# Patient Record
Sex: Male | Born: 1950 | Race: White | Hispanic: No | Marital: Married | Smoking: Never smoker
Health system: Southern US, Community
[De-identification: ages and names within clinical notes are randomized; demographics above are authoritative.]

---

## 2007-08-26 ENCOUNTER — Ambulatory Visit: Payer: Self-pay | Admitting: Family Medicine

## 2007-09-09 ENCOUNTER — Ambulatory Visit: Payer: Self-pay | Admitting: Family Medicine

## 2007-10-21 ENCOUNTER — Ambulatory Visit: Payer: Self-pay | Admitting: Family Medicine

## 2008-01-17 IMAGING — CT CT ABD-PELV W/O CM
4 of 8 series · 14 of 42 positions shown, 20 images · IV contrast (CONTRAST)
Comparison: none

[Series 3: arterial · axial · arterial · 0.68mm/px · z∈[+828,+908]mm · 2 of 50 slices shown]
[im 17/50  soft-tissue]
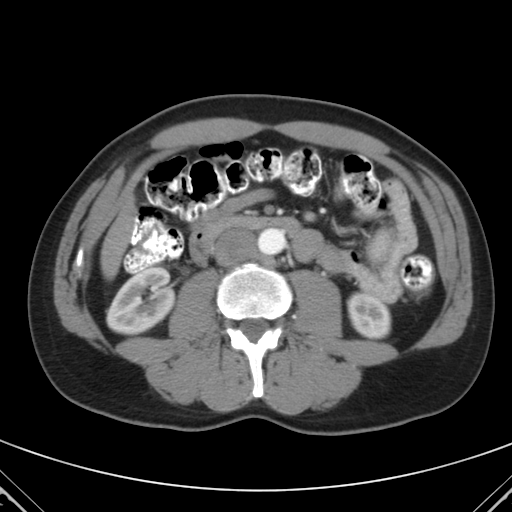
[im 33/50  soft-tissue]
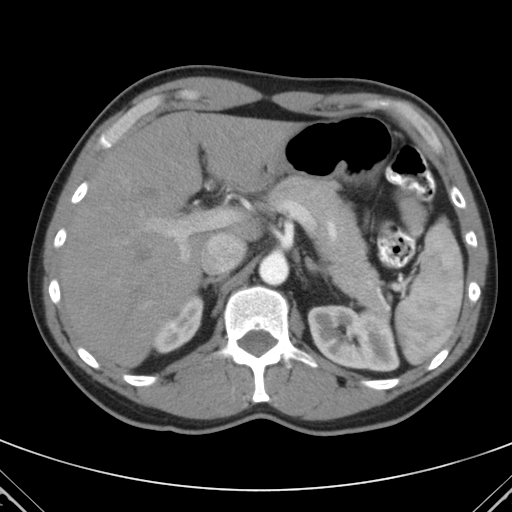

[Series 4: venous · axial · portal-venous · 0.68mm/px · z∈[+615,+915]mm · 5 of 91 slices shown, 10 images]
[im 16/91  soft-tissue]
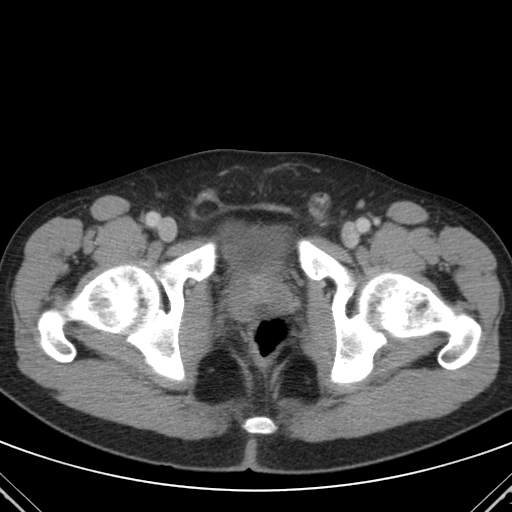
[im 16/91  bone]
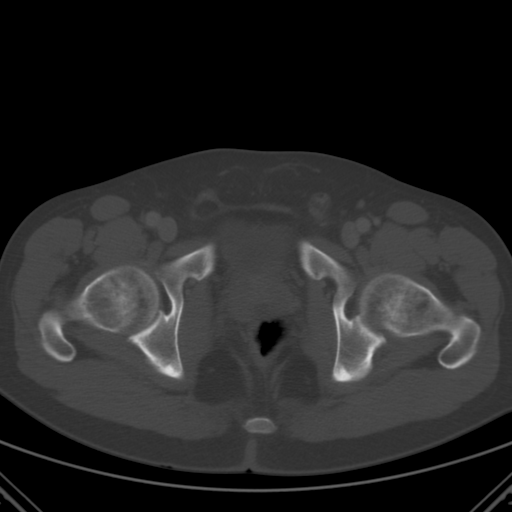
[im 31/91  soft-tissue]
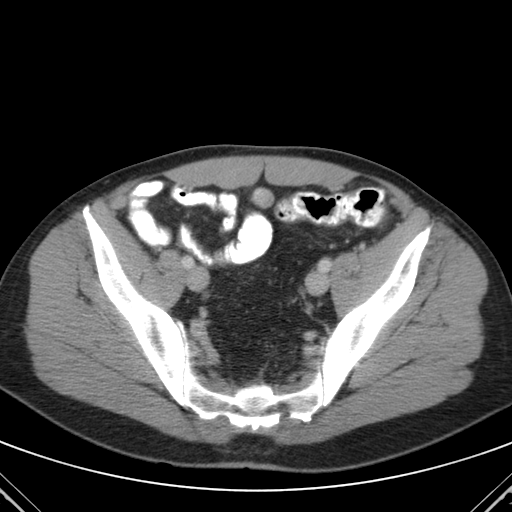
[im 31/91  lung]
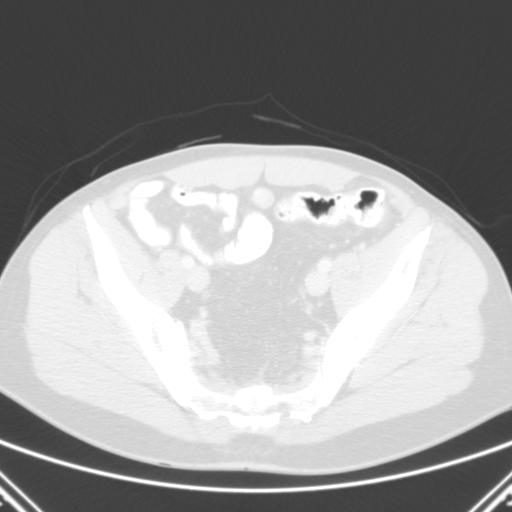
[im 46/91  soft-tissue]
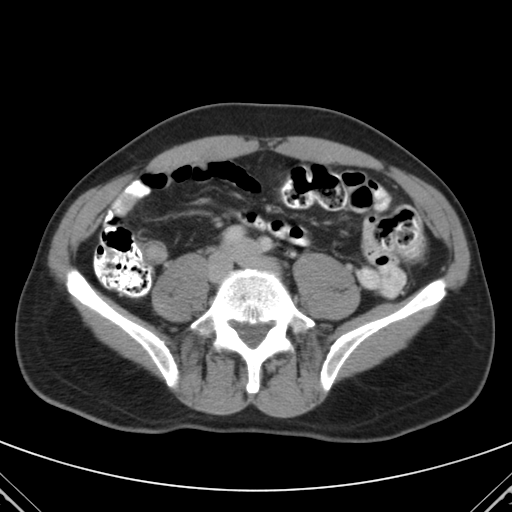
[im 46/91  lung]
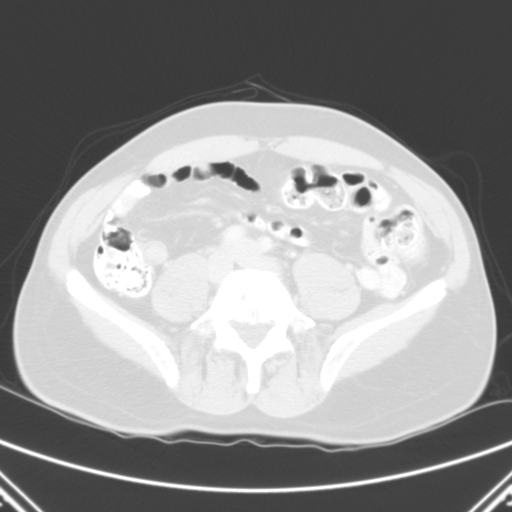
[im 61/91  soft-tissue]
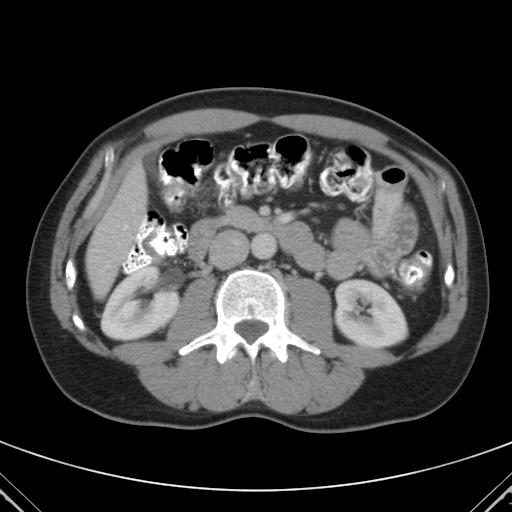
[im 61/91  lung]
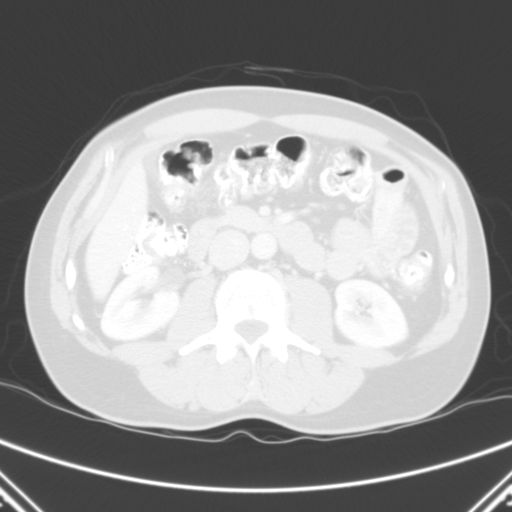
[im 76/91  soft-tissue]
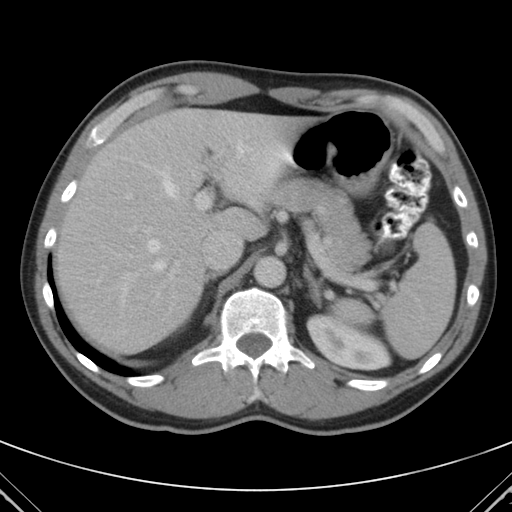
[im 76/91  lung]
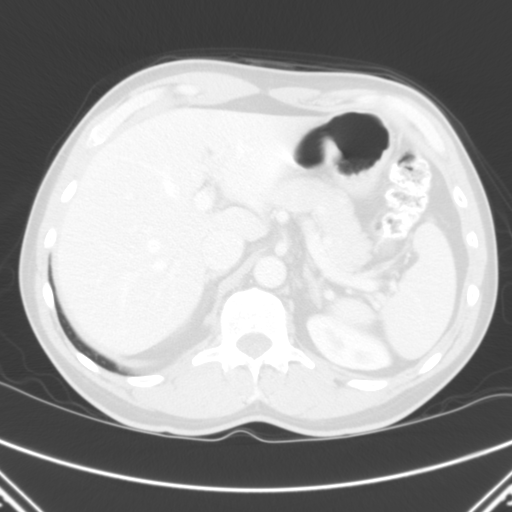

[Series 9: delays · axial · 0.68mm/px · z∈[+625,+880]mm · 4 of 86 slices shown]
[im 18/86  soft-tissue]
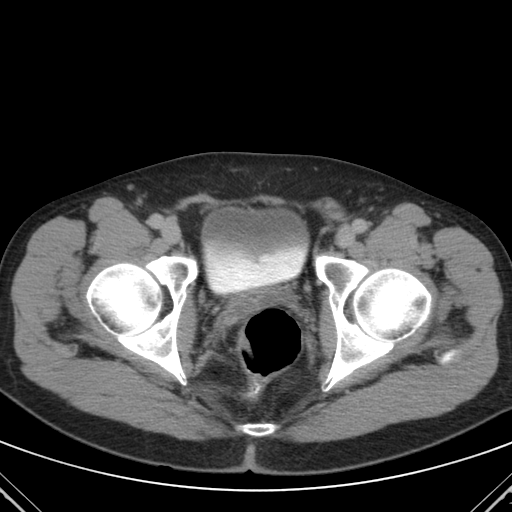
[im 35/86  soft-tissue]
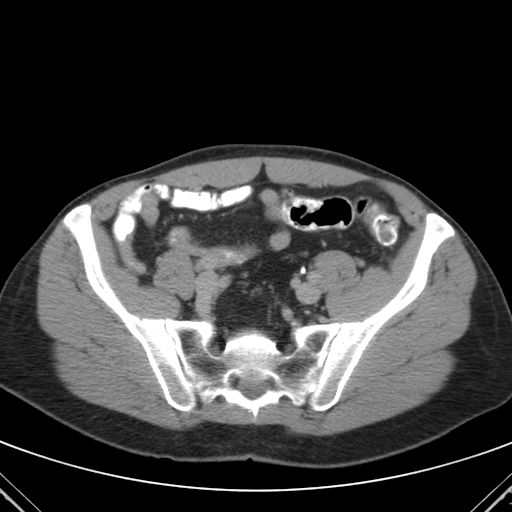
[im 52/86  soft-tissue]
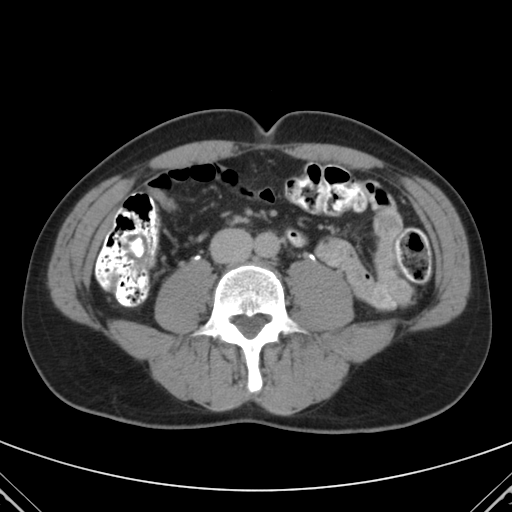
[im 69/86  soft-tissue]
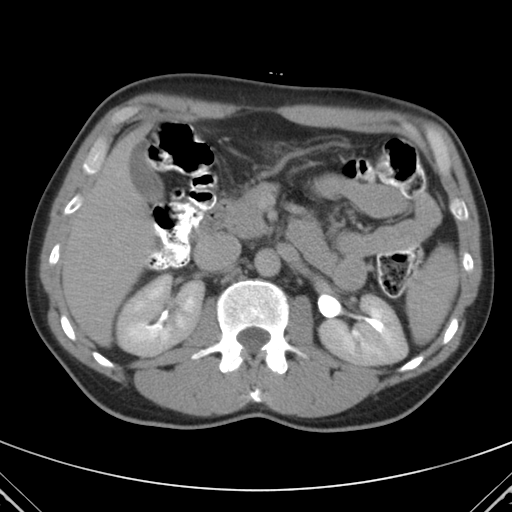

[Series 8058: coronals · coronal · 0.88mm/px · 3 of 74 slices shown, 4 images]
[im 25/74  soft-tissue]
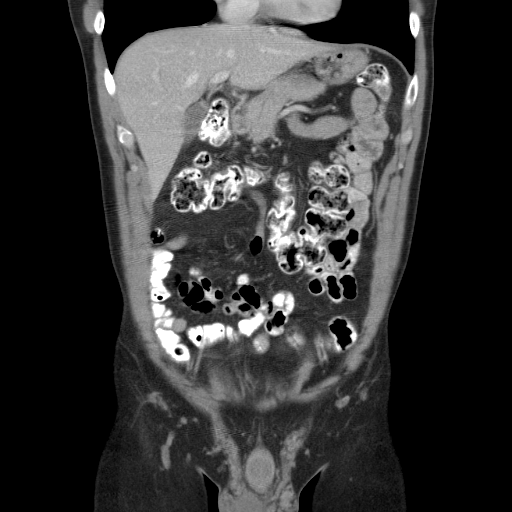
[im 33/74  soft-tissue]
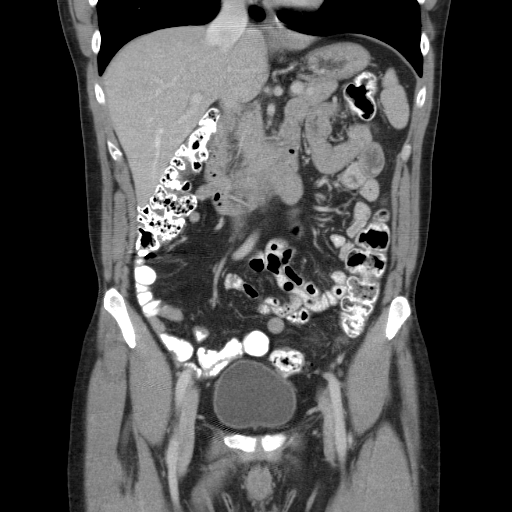
[im 33/74  bone]
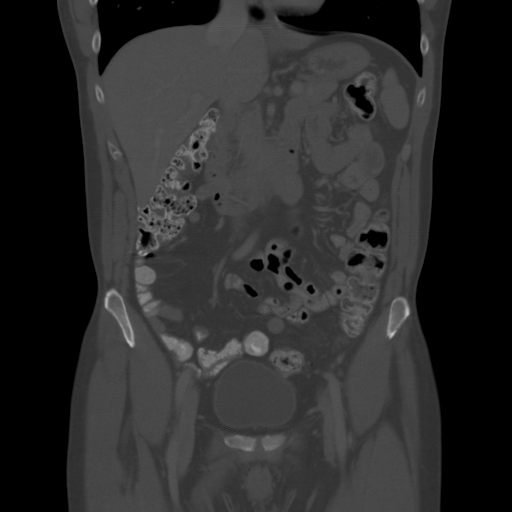
[im 41/74  soft-tissue]
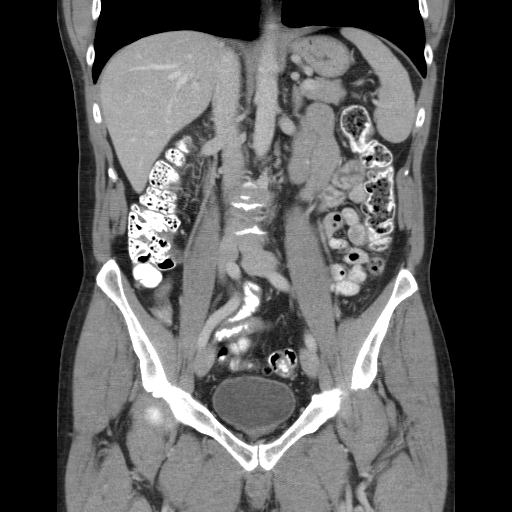

[14 of 42 positions shown; findings below may reference images not displayed]

Canned report from images found in remote index.

Refer to host system for actual result text.

## 2009-04-05 ENCOUNTER — Ambulatory Visit: Payer: Self-pay | Admitting: Family Medicine

## 2009-05-02 ENCOUNTER — Ambulatory Visit (HOSPITAL_COMMUNITY): Admission: RE | Admit: 2009-05-02 | Discharge: 2009-05-02 | Payer: Self-pay | Admitting: Surgery

## 2009-07-03 ENCOUNTER — Ambulatory Visit: Payer: Self-pay | Admitting: Family Medicine

## 2009-10-23 ENCOUNTER — Encounter: Admission: RE | Admit: 2009-10-23 | Discharge: 2009-10-23 | Payer: Self-pay | Admitting: Otolaryngology

## 2010-05-30 ENCOUNTER — Ambulatory Visit: Payer: Self-pay | Admitting: Family Medicine

## 2010-12-27 LAB — HEMOGLOBIN AND HEMATOCRIT, BLOOD: HCT: 46.2 % (ref 39.0–52.0)

## 2011-02-03 NOTE — Op Note (Signed)
Harris, Chad            ACCOUNT NO.:  [id number]   MEDICAL RECORD NO.:  [id number]          PATIENT TYPE:  AMB   LOCATION:  DAY                          FACILITY:  [facility]   PHYSICIAN:  Sandria Bales. Ezzard Standing, M.D.  DATE OF BIRTH:  25-Jun-1951   DATE OF PROCEDURE:  05/02/2009  DATE OF DISCHARGE:                               OPERATIVE REPORT   Date of Surgery - 02 May 2009   PREOPERATIVE DIAGNOSIS:  Right inguinal hernia, recurrent.   POSTOPERATIVE DIAGNOSIS:  Recurrent right inguinal hernia, direct.   PROCEDURE:  Laparoscopic right inguinal hernia repair.   SURGEON:  Dr. Ovidio Kin.   ASSESSMENT:  None.   ANESTHESIA:  General endotracheal.   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  I discussed with him the options of  treatment, both surgical laparoscopic and open repair.  He wants to  proceed with a laparoscopic hernia repair.  I discussed with him the  indications and potential complications, which include,  but are not  limited to bleeding, infection, nerve injury and recurrence of hernia.   DESCRIPTION OF OPERATIVE NOTE:  With the patient in the supine position,  was given general endotracheal anesthetic supervised by Dr. Fernanda Drum.  A time-out was held identifying the patient, the location of  the surgery and the procedure.  The surgical check list was run.   His was shaved and prepped with Betadine solution.  He had a Foley  catheter in place and sterilely draped.  I created an infraumbilical  incision and went to the rectus fascia on the right, the anterior rectus  fascia.  I got to the rectus muscles which were retracted anteriorly,  and got into the preperitoneal space where I passed the preperitoneal  balloon and insufflated this under direct visualization.   I then placed a left lower quadrant 5 mm Ethicon trocar and  a right  lower quadrant 5 mm trocar under direct visualization.  I got into the  preperitoneal space.  I dissected preperitoneal tissue off of the pubic  bone, off Cooper's ligament.  His hernia was a medium-sized direct  inguinal hernia.  I identified the cord structures and saw no evidence  of indirect inguinal hernia.  There was some scarring out further  laterally.  This was probably from his prior hernia surgery when he was  younger.  I then carried out an inguinal floor repair using the pre-cut  Atrium C-Qurlite mesh.  This lay flat against the inguinal floor,  covering the hernia defect.  I used nine staples to secure the mesh.   The patient tolerated the procedure well.  There was no bleeding.  The  mesh seemed to lay flat.  I then desufflated one time, to check to make  sure there were no problems.  Then I removed all trocars under direct  visualization.  I closed the anterior rectus fascia with an interrupted  0 Vicryl suture.  I infiltrated 20 mL  of 0.25% Marcaine and closed the  skin with a 5-0 Vicryl suture.  I painted the wound with Dermabond.   Sponge and needle count were correct at the end of the case.  He was  transferred to the recovery room in good condition.   DISPOSITION:  He will go home tonight.      Sandria Bales. Ezzard Standing, M.D.  Electronically Signed     DHN/MEDQ  D:  05/02/2009  T:  05/02/2009  Job:  [id number]   cc:   Sharlot Gowda, M.D.  Fax: [phone]

## 2011-04-13 ENCOUNTER — Encounter: Payer: Self-pay | Admitting: Family Medicine

## 2011-04-13 ENCOUNTER — Ambulatory Visit (INDEPENDENT_AMBULATORY_CARE_PROVIDER_SITE_OTHER): Payer: BC Managed Care – PPO | Admitting: Family Medicine

## 2011-04-13 VITALS — BP 150/92 | HR 68 | Wt 191.0 lb

## 2011-04-13 DIAGNOSIS — R51 Headache: Secondary | ICD-10-CM

## 2011-04-13 DIAGNOSIS — I499 Cardiac arrhythmia, unspecified: Secondary | ICD-10-CM

## 2011-04-13 LAB — CBC WITH DIFFERENTIAL/PLATELET
Basophils Relative: 1 % (ref 0–1)
Eosinophils Absolute: 0.1 10*3/uL (ref 0.0–0.7)
Eosinophils Relative: 1 % (ref 0–5)
Hemoglobin: 15 g/dL (ref 13.0–17.0)
MCH: 30.6 pg (ref 26.0–34.0)
MCV: 88.8 fL (ref 78.0–100.0)
Monocytes Absolute: 0.3 10*3/uL (ref 0.1–1.0)
Monocytes Relative: 5 % (ref 3–12)
Neutrophils Relative %: 68 % (ref 43–77)
Platelets: 143 10*3/uL — ABNORMAL LOW (ref 150–400)
RBC: 4.9 MIL/uL (ref 4.22–5.81)
WBC: 6 10*3/uL (ref 4.0–10.5)

## 2011-04-13 LAB — COMPREHENSIVE METABOLIC PANEL
AST: 21 U/L (ref 0–37)
Albumin: 4.6 g/dL (ref 3.5–5.2)
Alkaline Phosphatase: 41 U/L (ref 39–117)
BUN: 19 mg/dL (ref 6–23)
Calcium: 9.6 mg/dL (ref 8.4–10.5)
Chloride: 103 mEq/L (ref 96–112)
Creat: 0.99 mg/dL (ref 0.50–1.35)
Total Bilirubin: 0.7 mg/dL (ref 0.3–1.2)
Total Protein: 6.7 g/dL (ref 6.0–8.3)

## 2011-04-13 NOTE — Progress Notes (Signed)
  Subjective:    Patient ID: Chad Harris, male    DOB: 1951-08-24, 60 y.o.   MRN: [id number]  HPI   4 days ago he noted an irregular heart rate not captured with checking his pulse. He did feel some tension especially in his chest but no shortness of breath, chest pain, diaphoresis. At that time he had gone for a bicycle ride but did not experience any difficulty with that. He is also had more of these episodes not necessarily on a daily basis. He does not smoke. His father did have atrial fibrillation. Over the weekend he developed a headache and also neck discomfort. He was watching more TV then usual. It is bilateral, constant but no blurred vision, double vision, nasal congestion, rhinorrhea   Review of Systems Negative except as above    Objective:   Physical Exam alert and in no distress. Tympanic membranes and canals are normal. Throat is clear. Tonsils are normal. Neck is supple without adenopathy or thyromegaly. Cardiac exam shows a regular sinus rhythm without murmurs or gallops. Lungs are clear to auscultation. EKG shows no acute changes       Assessment & Plan:  Arrhythmia. Headache. I will do routine screening on him. Recommend conservative care for the headache. He will be aware of his symptoms especially in regards to physical activities. He'll be set up for an event monitor.

## 2011-07-17 IMAGING — CR DG ORBITS FOR FOREIGN BODY
2 series · 2 of 2 positions shown · non-contrast
Comparison: None

CLINICAL DATA: Pre MRI/history of metal removed from I

ORBITS FOR FOREIGN BODY - 2 VIEW

[view not recorded (1 of 2)]
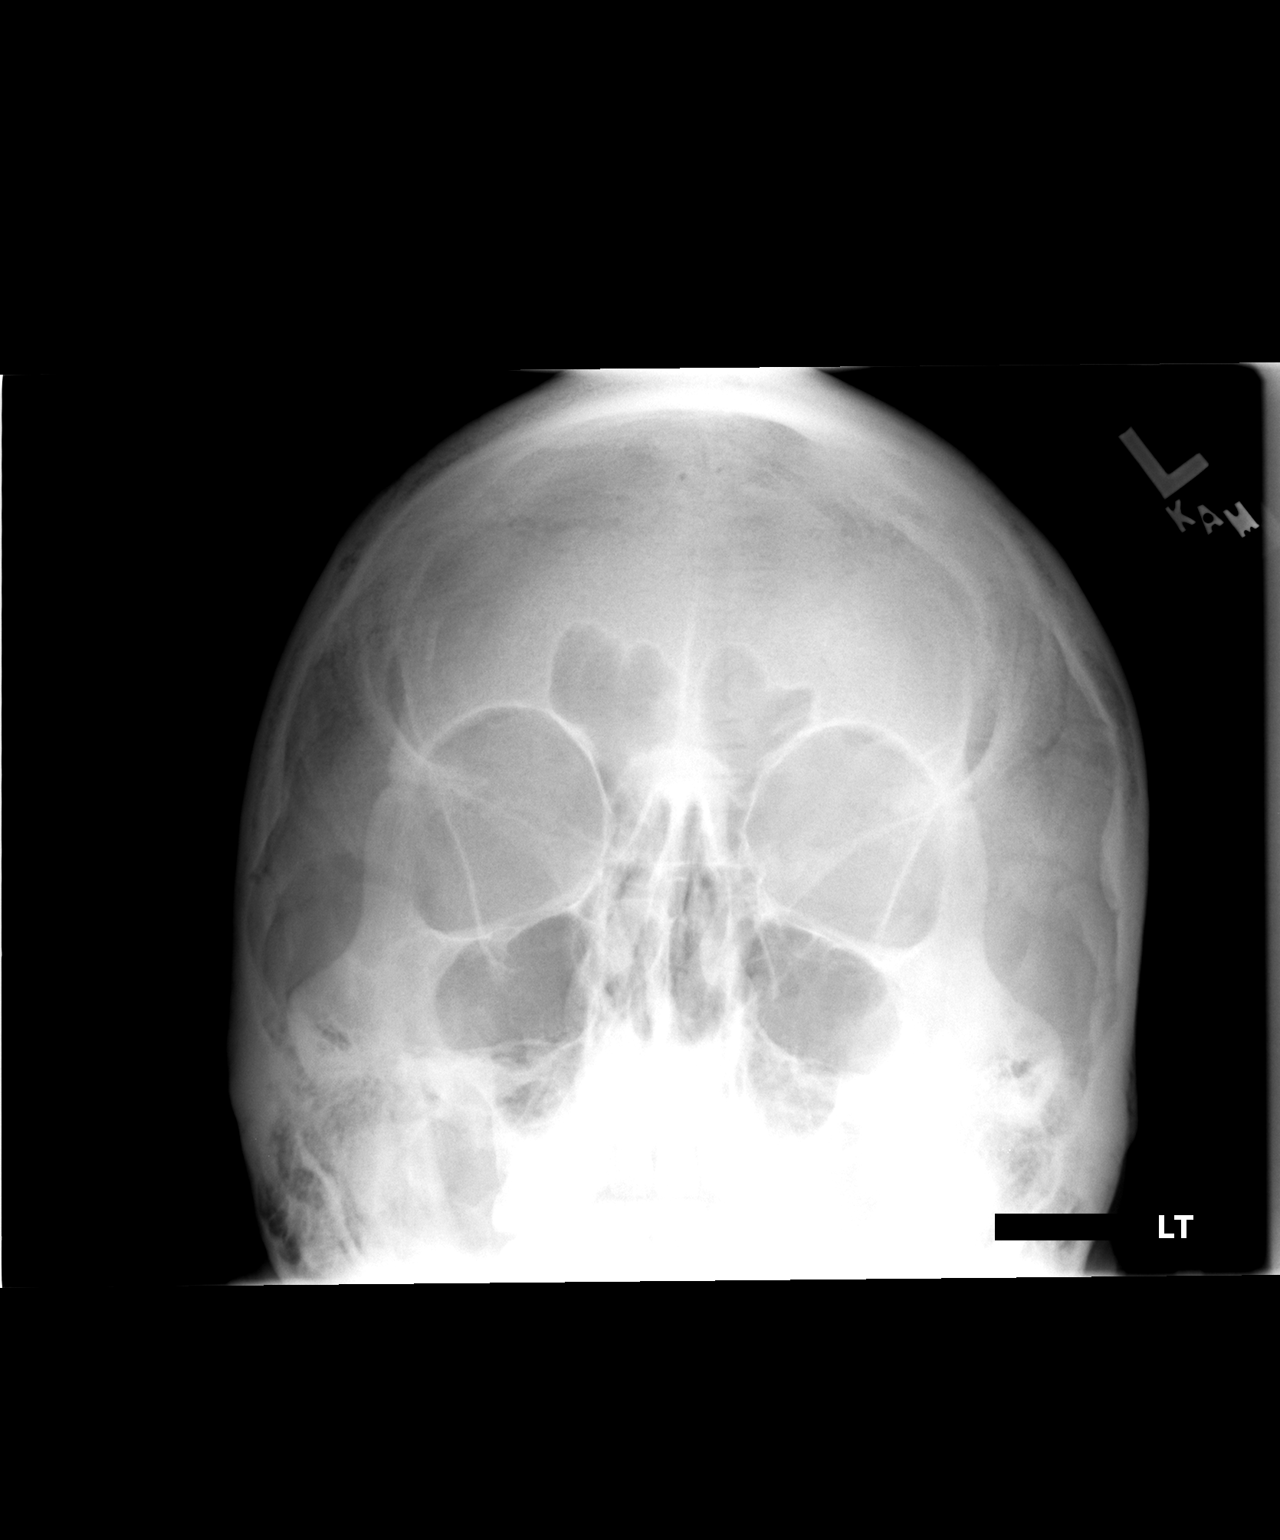

[view not recorded (2 of 2)]
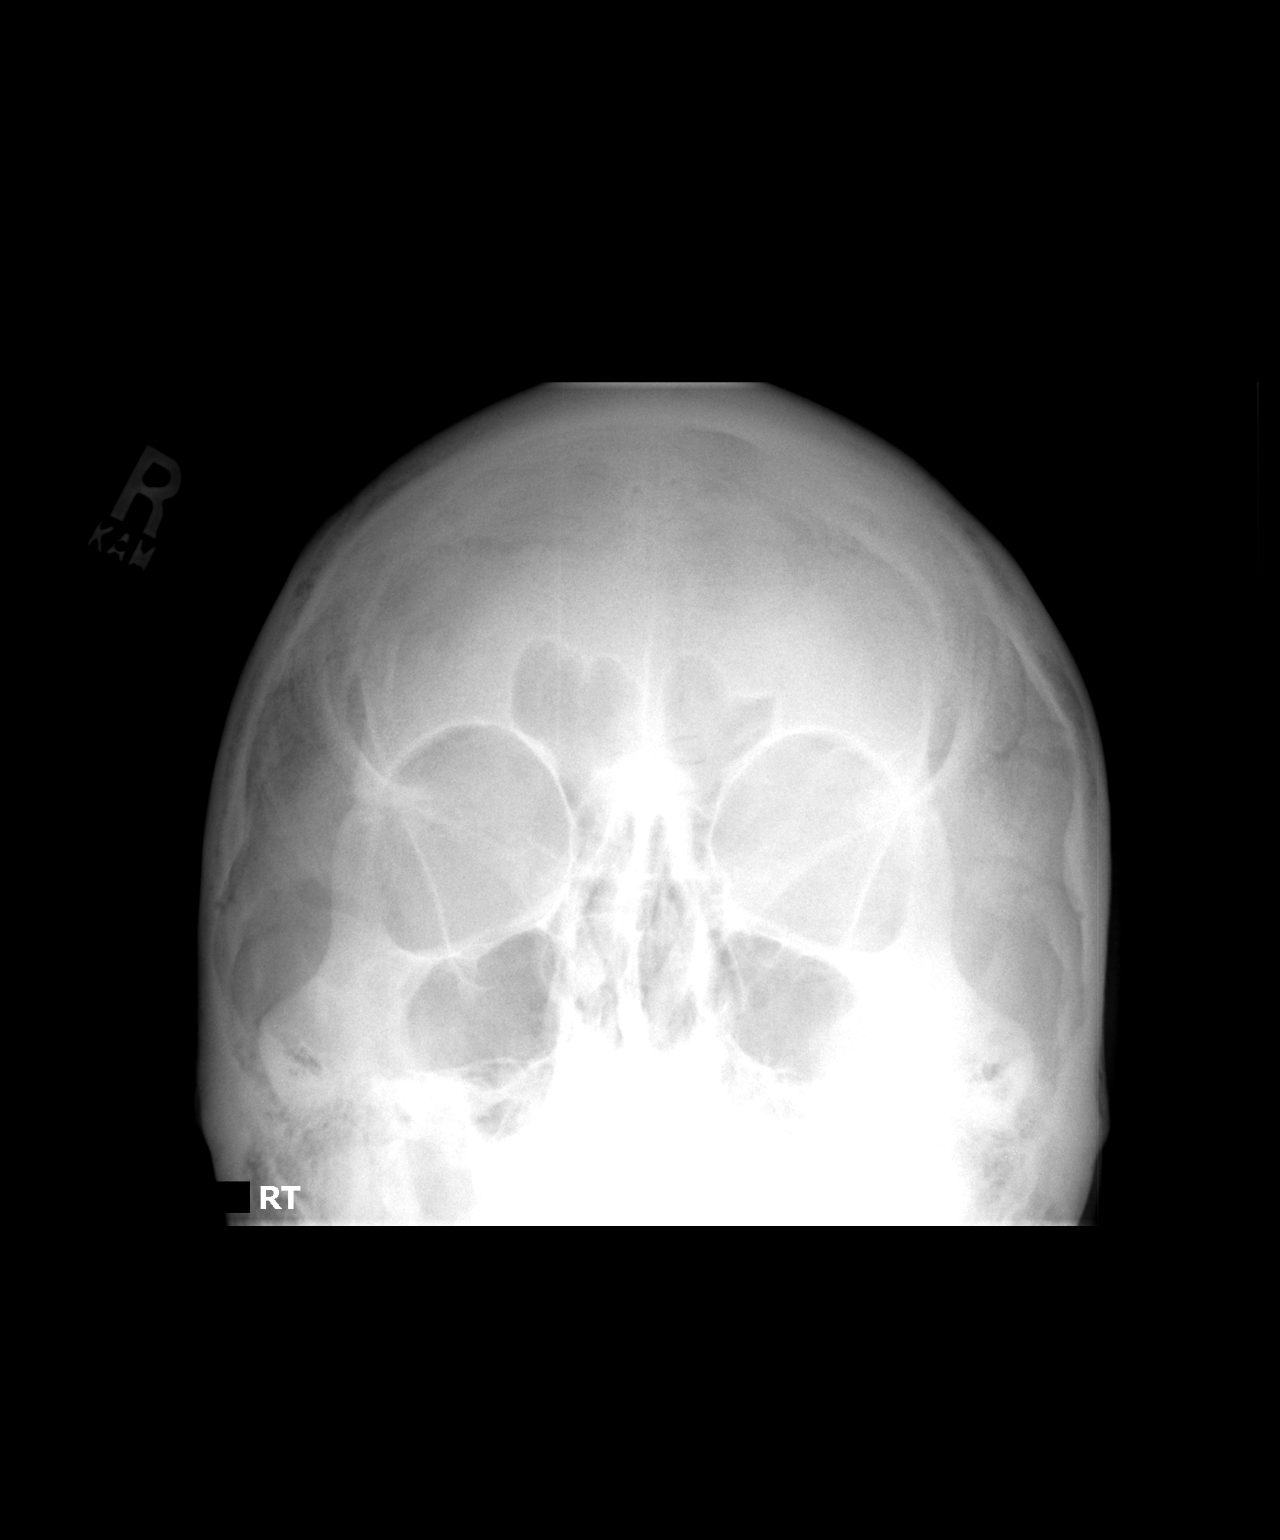

[2 of 2 positions shown; findings below may reference images not displayed]

FINDINGS: No radiopaque foreign body projects over either orbit. No
other pathological findings.
IMPRESSION: No evidence of metallic density foreign body to preclude MRI
scanning.

## 2012-11-11 ENCOUNTER — Ambulatory Visit (INDEPENDENT_AMBULATORY_CARE_PROVIDER_SITE_OTHER): Payer: BC Managed Care – PPO | Admitting: Family Medicine

## 2012-11-11 ENCOUNTER — Encounter: Payer: Self-pay | Admitting: Family Medicine

## 2012-11-11 VITALS — BP 114/72 | HR 74 | Wt 190.0 lb

## 2012-11-11 DIAGNOSIS — Z79899 Other long term (current) drug therapy: Secondary | ICD-10-CM

## 2012-11-11 DIAGNOSIS — R6882 Decreased libido: Secondary | ICD-10-CM

## 2012-11-11 DIAGNOSIS — N529 Male erectile dysfunction, unspecified: Secondary | ICD-10-CM

## 2012-11-11 DIAGNOSIS — R351 Nocturia: Secondary | ICD-10-CM

## 2012-11-11 DIAGNOSIS — Z2911 Encounter for prophylactic immunotherapy for respiratory syncytial virus (RSV): Secondary | ICD-10-CM

## 2012-11-11 LAB — COMPREHENSIVE METABOLIC PANEL
AST: 24 U/L (ref 0–37)
Albumin: 4.5 g/dL (ref 3.5–5.2)
Alkaline Phosphatase: 48 U/L (ref 39–117)
Calcium: 9.5 mg/dL (ref 8.4–10.5)
Potassium: 4.5 mEq/L (ref 3.5–5.3)
Sodium: 139 mEq/L (ref 135–145)
Total Bilirubin: 0.7 mg/dL (ref 0.3–1.2)
Total Protein: 6.7 g/dL (ref 6.0–8.3)

## 2012-11-11 LAB — CBC WITH DIFFERENTIAL/PLATELET
Basophils Absolute: 0 10*3/uL (ref 0.0–0.1)
Eosinophils Absolute: 0.1 10*3/uL (ref 0.0–0.7)
Hemoglobin: 15.6 g/dL (ref 13.0–17.0)
Lymphocytes Relative: 37 % (ref 12–46)
MCHC: 35.3 g/dL (ref 30.0–36.0)
MCV: 85.5 fL (ref 78.0–100.0)
Monocytes Absolute: 0.4 10*3/uL (ref 0.1–1.0)
Monocytes Relative: 8 % (ref 3–12)
RBC: 5.17 MIL/uL (ref 4.22–5.81)
WBC: 5.1 10*3/uL (ref 4.0–10.5)

## 2012-11-11 LAB — LIPID PANEL
HDL: 53 mg/dL (ref >39)
LDL Cholesterol: 56 mg/dL (ref 0–99)
Total CHOL/HDL Ratio: 2.3 Ratio

## 2012-11-11 LAB — HEMOCCULT GUIAC POC 1CARD (OFFICE)

## 2012-11-11 NOTE — Progress Notes (Signed)
  Subjective:    Patient ID: Chad Harris, male    DOB: 1950/11/17, 62 y.o.   MRN: [id number]  HPI He is here for consult concerning several issues. He notes increasing difficulty over the last several years with nocturia. Now he is getting up sometimes as many as 3 and 4 times per night. This is starting to interfere with his social functioning in that he now transposition himself but that he can get to a bathroom without too much difficulty. He states his stream is okay. He does have some hesitancy but does not complaining of incomplete emptying. He's had no back pain, dysuria. He also complains of decreased libido as well as some intermittent difficulty with erectile dysfunction. He has not noted any energy or stamina issues. Work is quite stressful however his home life is going quite well.   Review of Systems     Objective:   Physical Exam alert and in no distress. Tympanic membranes and canals are normal. Throat is clear. Tonsils are normal. Neck is supple without adenopathy or thyromegaly. Cardiac exam shows a regular sinus rhythm without murmurs or gallops. Lungs are clear to auscultation. Rectal exam shows a slightly large prostate with no nodules. Guaiac is negative.       Assessment & Plan:  ED (erectile dysfunction) - Plan: PSA  Libido, decreased - Plan: PSA, Testosterone  Nocturia - Plan: Hemoccult - 1 Card (office)  Encounter for long-term (current) use of other medications - Plan: Lipid panel, CBC with Differential, Comprehensive metabolic panel, Hemoccult - 1 Card (office) I discussed options with him concerning the use of an alpha blocking agent versus shrinking the prostate. I will wait for the lab data come back. Also sample of Cialis given with instructions on use. Followup pending blood results.

## 2012-11-14 NOTE — Progress Notes (Signed)
Quick Note:  I called and gave him the results. Nothing needs to be done at this point ______

## 2013-01-05 ENCOUNTER — Telehealth: Payer: Self-pay | Admitting: Internal Medicine

## 2013-01-05 NOTE — Telephone Encounter (Signed)
Faxed medical records to [facility] @ [phone]

## 2013-11-03 ENCOUNTER — Ambulatory Visit (INDEPENDENT_AMBULATORY_CARE_PROVIDER_SITE_OTHER): Payer: BC Managed Care – PPO | Admitting: Family Medicine

## 2013-11-03 ENCOUNTER — Encounter: Payer: Self-pay | Admitting: Family Medicine

## 2013-11-03 VITALS — BP 120/70 | HR 72 | Wt 191.0 lb

## 2013-11-03 DIAGNOSIS — N529 Male erectile dysfunction, unspecified: Secondary | ICD-10-CM

## 2013-11-03 DIAGNOSIS — N318 Other neuromuscular dysfunction of bladder: Secondary | ICD-10-CM

## 2013-11-03 DIAGNOSIS — N3281 Overactive bladder: Secondary | ICD-10-CM

## 2013-11-03 NOTE — Progress Notes (Signed)
   Subjective:    Patient ID: Chad Harris, male    DOB: [birth year], 63 y.o.   MRN: [id number]  HPI He is here for consult concerning continued difficulty with erectile dysfunction as well as bladder issues. He notes that he does have intermittent difficulty with urinary urgency however he does not describe incomplete emptying, decreased stream or hesitancy. He also states that these symptoms do not occur continuously. He did try Cialis in the past but did have some difficulty with muscle aches and pains and stopped.   Review of Systems     Objective:   Physical Exam Alert and in no distress otherwise not examined       Assessment & Plan:  ED (erectile dysfunction)  OAB (overactive bladder)  Discussed starting the Cialis again to see if the symptoms were really related to this medication. Discussed possibly using a different ED medication as well as something more specific for his intermittent OAB symptoms. He will let me know how the Cialis works. Samples were given.

## 2016-06-19 ENCOUNTER — Encounter: Payer: Self-pay | Admitting: Family Medicine

## 2016-06-19 ENCOUNTER — Ambulatory Visit (INDEPENDENT_AMBULATORY_CARE_PROVIDER_SITE_OTHER): Payer: 59 | Admitting: Family Medicine

## 2016-06-19 VITALS — BP 130/78 | HR 69 | Ht 74.0 in | Wt 193.0 lb

## 2016-06-19 DIAGNOSIS — Z125 Encounter for screening for malignant neoplasm of prostate: Secondary | ICD-10-CM | POA: Diagnosis not present

## 2016-06-19 DIAGNOSIS — R351 Nocturia: Secondary | ICD-10-CM | POA: Diagnosis not present

## 2016-06-19 DIAGNOSIS — Z13220 Encounter for screening for lipoid disorders: Secondary | ICD-10-CM | POA: Diagnosis not present

## 2016-06-19 DIAGNOSIS — Z8042 Family history of malignant neoplasm of prostate: Secondary | ICD-10-CM | POA: Diagnosis not present

## 2016-06-19 DIAGNOSIS — Z1211 Encounter for screening for malignant neoplasm of colon: Secondary | ICD-10-CM | POA: Diagnosis not present

## 2016-06-19 DIAGNOSIS — N529 Male erectile dysfunction, unspecified: Secondary | ICD-10-CM

## 2016-06-19 DIAGNOSIS — N401 Enlarged prostate with lower urinary tract symptoms: Secondary | ICD-10-CM | POA: Diagnosis not present

## 2016-06-19 LAB — LIPID PANEL
Cholesterol: 130 mg/dL (ref 125–200)
HDL: 61 mg/dL (ref >=40)
LDL CALC: 52 mg/dL (ref <130)
Total CHOL/HDL Ratio: 2.1 Ratio (ref <=5.0)
Triglycerides: 86 mg/dL (ref <150)
VLDL: 17 mg/dL (ref <30)

## 2016-06-19 LAB — COMPREHENSIVE METABOLIC PANEL
ALK PHOS: 48 U/L (ref 40–115)
ALT: 21 U/L (ref 9–46)
AST: 22 U/L (ref 10–35)
Albumin: 4.8 g/dL (ref 3.6–5.1)
BILIRUBIN TOTAL: 0.8 mg/dL (ref 0.2–1.2)
BUN: 15 mg/dL (ref 7–25)
CO2: 29 mmol/L (ref 20–31)
CREATININE: 1.06 mg/dL (ref 0.70–1.25)
Calcium: 10.3 mg/dL (ref 8.6–10.3)
Chloride: 103 mmol/L (ref 98–110)
GLUCOSE: 98 mg/dL (ref 65–99)
Potassium: 4.7 mmol/L (ref 3.5–5.3)
SODIUM: 137 mmol/L (ref 135–146)
Total Protein: 7.3 g/dL (ref 6.1–8.1)

## 2016-06-19 LAB — CBC WITH DIFFERENTIAL/PLATELET
BASOS PCT: 1 %
Basophils Absolute: 71 cells/uL (ref 0–200)
EOS PCT: 2 %
Eosinophils Absolute: 142 cells/uL (ref 15–500)
HCT: 47.8 % (ref 38.5–50.0)
Hemoglobin: 17 g/dL (ref 13.2–17.1)
LYMPHS PCT: 28 %
Lymphs Abs: 1988 cells/uL (ref 850–3900)
MCH: 30.8 pg (ref 27.0–33.0)
MCHC: 35.6 g/dL (ref 32.0–36.0)
MCV: 86.6 fL (ref 80.0–100.0)
MONO ABS: 639 {cells}/uL (ref 200–950)
MPV: 8.9 fL (ref 7.5–12.5)
Monocytes Relative: 9 %
NEUTROS PCT: 60 %
Neutro Abs: 4260 cells/uL (ref 1500–7800)
PLATELETS: 167 10*3/uL (ref 140–400)
RBC: 5.52 MIL/uL (ref 4.20–5.80)
RDW: 13.5 % (ref 11.0–15.0)
WBC: 7.1 10*3/uL (ref 4.0–10.5)

## 2016-06-19 LAB — PSA: PSA: 1.1 ng/mL (ref <=4.0)

## 2016-06-19 LAB — HEMOCCULT GUIAC POC 1CARD (OFFICE)
FECAL OCCULT BLD: NEGATIVE
OCCULT BLOOD DATE: 92917

## 2016-06-19 NOTE — Progress Notes (Signed)
   Subjective:    Patient ID: Chad Harris, male    DOB: 03-25-1951, 65 y.o.   MRN: [id number]  HPI He is here for consultation concerning increasing difficulty with nocturia. Sometimes can get up as much as 3 times per night. He does note slight decrease in his stream as well as questionable incomplete emptying but no hesitancy or history of urgency. He also has had intermittent difficulty with erectile dysfunction. He has not been seen in quite some time and would like to have further blood work done. He does have a family history of prostate cancer in that his father in his mid 68s was apparently treated with radiation. His last colonoscopy was in 2004. Immunizations were reviewed and he thinks he has had tetanus and will check his record. Work and home life are going quite well.   Review of Systems     Objective:   Physical Exam Alert and in no distress. Tympanic membranes and canals are normal. Pharyngeal area is normal. Neck is supple without adenopathy or thyromegaly. Cardiac exam shows a regular sinus rhythm without murmurs or gallops. Lungs are clear to auscultation. Abdominal exam shows no masses or tenderness. Genital exam shows normal penis and testes. Rectal exam does show a slightly large prostate, no nodules palpable. Guaiac was negative.        Assessment & Plan:  BPH associated with nocturia - Plan: CBC with Differential/Platelet, Comprehensive metabolic panel, PSA  Erectile dysfunction, unspecified erectile dysfunction type - Plan: CBC with Differential/Platelet, Comprehensive metabolic panel  Family history of prostate cancer in father - Plan: PSA  Screening for prostate cancer - Plan: PSA  Screening for colon cancer - Plan: POCT occult blood stool  Screening for lipid disorders - Plan: Lipid panel Discussed treatment options and it looks more like these are prostate related symptoms as opposed bladder and will more likely place him on finasteride. He will  check with his insurance concerning coverage for Cologuard for colon cancer screening. He will also check his records to see if he has had a recent tetanus shot.

## 2016-06-25 ENCOUNTER — Telehealth: Payer: Self-pay | Admitting: Family Medicine

## 2016-06-25 MED ORDER — FINASTERIDE 5 MG PO TABS: 5.0000 mg | ORAL_TABLET | Freq: Every day | ORAL | 3 refills | Status: DC

## 2016-06-25 NOTE — Telephone Encounter (Signed)
Let him know that I called in the finasteride and have him come in Friday for a TDaP

## 2016-06-25 NOTE — Telephone Encounter (Signed)
Left vm word for word and put him on nurse sched.

## 2016-06-25 NOTE — Telephone Encounter (Signed)
Pt called and was wanting to know if you wanted to put him on Flomax or Finasteride , pt also said he needed a up to date tetanus shot and would like to come in on Friday if possible, pt can be reached at [phone] and leave a message on that if needed

## 2016-06-26 ENCOUNTER — Other Ambulatory Visit (INDEPENDENT_AMBULATORY_CARE_PROVIDER_SITE_OTHER): Payer: 59

## 2016-06-26 DIAGNOSIS — Z23 Encounter for immunization: Secondary | ICD-10-CM | POA: Diagnosis not present

## 2016-09-09 ENCOUNTER — Encounter: Payer: Self-pay | Admitting: Family Medicine

## 2016-09-09 ENCOUNTER — Ambulatory Visit (INDEPENDENT_AMBULATORY_CARE_PROVIDER_SITE_OTHER): Payer: 59 | Admitting: Family Medicine

## 2016-09-09 VITALS — BP 116/78 | HR 74 | Ht 74.0 in | Wt 199.0 lb

## 2016-09-09 DIAGNOSIS — N529 Male erectile dysfunction, unspecified: Secondary | ICD-10-CM | POA: Diagnosis not present

## 2016-09-09 DIAGNOSIS — N401 Enlarged prostate with lower urinary tract symptoms: Secondary | ICD-10-CM

## 2016-09-09 DIAGNOSIS — R1031 Right lower quadrant pain: Secondary | ICD-10-CM | POA: Diagnosis not present

## 2016-09-09 DIAGNOSIS — R351 Nocturia: Secondary | ICD-10-CM | POA: Diagnosis not present

## 2016-09-09 MED ORDER — AVANAFIL 200 MG PO TABS: 1.0000 | ORAL_TABLET | ORAL | 0 refills | Status: DC

## 2016-09-09 NOTE — Progress Notes (Signed)
   Subjective:    Patient ID: Chad Harris, male    DOB: 09-Aug-1951, 65 y.o.   MRN: [id number]  HPI He is here for consult concerning multiple issues. He had right herniorrhaphy in 2010 and did experience some postoperative discomfort in that area. He has had intermittent discomfort in that area until last 2 weeks when the discomfort has been pretty regular I will read does change slightly with some motions. He's felt no lesions in that area. Bowel habits and urinary habits have not changed. He's had no discharge dysuria. He does have a previous history of difficulty with urgency but no incomplete emptying or decreased stream. He has tried regular doses of Cialis to help with both his ED and possibly BPH related symptoms but states that it did not help and adnexa caused back discomfort. In the past he also tried finasteride and found it to not help with his urinary symptoms and possibly did interfere with erectile issues.   Review of Systems     Objective:   Physical Exam Alert and in no distress. Abdominal exam shows no masses or tenderness. Inguinal area was nontender. No hernia noted. Testes normal. Rectal not done. Done on his previous exam.       Assessment & Plan:  BPH associated with nocturia  Erectile dysfunction, unspecified erectile dysfunction type - Plan: Avanafil 200 MG TABS  Right inguinal pain - Plan: Ambulatory referral to General Surgery I will refer to Gen. surgery to further evaluate a right inguinal discomfort. We also talked about referral to urology but will wait for general surgery and then proceed further with urologic evaluation help with both the ED and urinary symptoms.

## 2017-12-07 DIAGNOSIS — H90A22 Sensorineural hearing loss, unilateral, left ear, with restricted hearing on the contralateral side: Secondary | ICD-10-CM | POA: Diagnosis not present

## 2017-12-07 DIAGNOSIS — H90A31 Mixed conductive and sensorineural hearing loss, unilateral, right ear with restricted hearing on the contralateral side: Secondary | ICD-10-CM | POA: Diagnosis not present

## 2017-12-15 DIAGNOSIS — H52203 Unspecified astigmatism, bilateral: Secondary | ICD-10-CM | POA: Diagnosis not present

## 2017-12-15 DIAGNOSIS — H524 Presbyopia: Secondary | ICD-10-CM | POA: Diagnosis not present

## 2018-05-12 ENCOUNTER — Telehealth: Payer: Self-pay | Admitting: Family Medicine

## 2018-05-12 NOTE — Telephone Encounter (Signed)
Pt called requesting that Dr. Redmond School call him at [phone] regarding something personal

## 2018-06-27 ENCOUNTER — Telehealth: Payer: Self-pay | Admitting: Family Medicine

## 2018-06-27 DIAGNOSIS — Z13220 Encounter for screening for lipoid disorders: Secondary | ICD-10-CM

## 2018-06-27 DIAGNOSIS — N400 Enlarged prostate without lower urinary tract symptoms: Secondary | ICD-10-CM

## 2018-06-27 DIAGNOSIS — N529 Male erectile dysfunction, unspecified: Secondary | ICD-10-CM

## 2018-06-27 NOTE — Telephone Encounter (Signed)
Have him come in 

## 2018-06-27 NOTE — Telephone Encounter (Signed)
Pt coming in for his Welcome to Medicare CPE on this Wednesday (10/9). Pt would like to come in tomorrow for prior labs if possible. Is this ok?

## 2018-06-27 NOTE — Telephone Encounter (Signed)
Pt aware and will be in tomorrow. Chad Harris

## 2018-06-28 ENCOUNTER — Other Ambulatory Visit: Payer: Medicare HMO

## 2018-06-28 DIAGNOSIS — N400 Enlarged prostate without lower urinary tract symptoms: Secondary | ICD-10-CM | POA: Diagnosis not present

## 2018-06-28 DIAGNOSIS — N529 Male erectile dysfunction, unspecified: Secondary | ICD-10-CM | POA: Diagnosis not present

## 2018-06-28 DIAGNOSIS — Z13220 Encounter for screening for lipoid disorders: Secondary | ICD-10-CM

## 2018-06-28 LAB — CBC WITH DIFFERENTIAL/PLATELET
Basophils Absolute: 0.1 10*3/uL (ref 0.0–0.2)
Basos: 1 %
EOS (ABSOLUTE): 0.1 10*3/uL (ref 0.0–0.4)
Eos: 2 %
Hematocrit: 46 % (ref 37.5–51.0)
Hemoglobin: 16.2 g/dL (ref 13.0–17.7)
IMMATURE GRANULOCYTES: 0 %
Immature Grans (Abs): 0 10*3/uL (ref 0.0–0.1)
Lymphocytes Absolute: 1.8 10*3/uL (ref 0.7–3.1)
Lymphs: 33 %
MCH: 30.7 pg (ref 26.6–33.0)
MCHC: 35.2 g/dL (ref 31.5–35.7)
MCV: 87 fL (ref 79–97)
MONOS ABS: 0.5 10*3/uL (ref 0.1–0.9)
Monocytes: 10 %
NEUTROS PCT: 54 %
Neutrophils Absolute: 2.9 10*3/uL (ref 1.4–7.0)
PLATELETS: 190 10*3/uL (ref 150–450)
RBC: 5.28 x10E6/uL (ref 4.14–5.80)
RDW: 13.3 % (ref 12.3–15.4)
WBC: 5.3 10*3/uL (ref 3.4–10.8)

## 2018-06-28 LAB — COMPREHENSIVE METABOLIC PANEL
A/G RATIO: 2.3 — AB (ref 1.2–2.2)
ALK PHOS: 55 IU/L (ref 39–117)
ALT: 17 IU/L (ref 0–44)
AST: 21 IU/L (ref 0–40)
Albumin: 4.5 g/dL (ref 3.6–4.8)
BUN/Creatinine Ratio: 13 (ref 10–24)
BUN: 14 mg/dL (ref 8–27)
Bilirubin Total: 0.7 mg/dL (ref 0.0–1.2)
CO2: 26 mmol/L (ref 20–29)
Calcium: 9.6 mg/dL (ref 8.6–10.2)
Chloride: 102 mmol/L (ref 96–106)
Creatinine, Ser: 1.07 mg/dL (ref 0.76–1.27)
GFR calc Af Amer: 84 mL/min/{1.73_m2} (ref >59)
GFR calc non Af Amer: 72 mL/min/{1.73_m2} (ref >59)
GLOBULIN, TOTAL: 2 g/dL (ref 1.5–4.5)
Glucose: 95 mg/dL (ref 65–99)
POTASSIUM: 5.1 mmol/L (ref 3.5–5.2)
SODIUM: 141 mmol/L (ref 134–144)
Total Protein: 6.5 g/dL (ref 6.0–8.5)

## 2018-06-28 LAB — LIPID PANEL
CHOLESTEROL TOTAL: 118 mg/dL (ref 100–199)
Chol/HDL Ratio: 2.3 ratio (ref 0.0–5.0)
HDL: 52 mg/dL (ref >39)
LDL CALC: 52 mg/dL (ref 0–99)
TRIGLYCERIDES: 69 mg/dL (ref 0–149)
VLDL CHOLESTEROL CAL: 14 mg/dL (ref 5–40)

## 2018-06-29 ENCOUNTER — Ambulatory Visit: Payer: 59 | Admitting: Family Medicine

## 2018-07-05 ENCOUNTER — Encounter: Payer: Self-pay | Admitting: Family Medicine

## 2018-07-05 ENCOUNTER — Ambulatory Visit (INDEPENDENT_AMBULATORY_CARE_PROVIDER_SITE_OTHER): Payer: Medicare HMO | Admitting: Family Medicine

## 2018-07-05 DIAGNOSIS — R351 Nocturia: Secondary | ICD-10-CM | POA: Diagnosis not present

## 2018-07-05 DIAGNOSIS — Z0000 Encounter for general adult medical examination without abnormal findings: Secondary | ICD-10-CM

## 2018-07-05 DIAGNOSIS — Z23 Encounter for immunization: Secondary | ICD-10-CM | POA: Diagnosis not present

## 2018-07-05 DIAGNOSIS — N401 Enlarged prostate with lower urinary tract symptoms: Secondary | ICD-10-CM

## 2018-07-05 DIAGNOSIS — Z1159 Encounter for screening for other viral diseases: Secondary | ICD-10-CM

## 2018-07-05 DIAGNOSIS — N529 Male erectile dysfunction, unspecified: Secondary | ICD-10-CM | POA: Diagnosis not present

## 2018-07-05 DIAGNOSIS — Z1211 Encounter for screening for malignant neoplasm of colon: Secondary | ICD-10-CM

## 2018-07-05 DIAGNOSIS — Z13220 Encounter for screening for lipoid disorders: Secondary | ICD-10-CM

## 2018-07-05 MED ORDER — OFLOXACIN 0.3 % OP SOLN: 1.0000 [drp] | Freq: Four times a day (QID) | OPHTHALMIC | 1 refills | Status: DC

## 2018-07-05 MED ORDER — IBUPROFEN 600 MG PO TABS
600.0000 mg | ORAL_TABLET | Freq: Four times a day (QID) | ORAL | 1 refills | Status: DC | PRN
Start: 2018-07-05 — End: 2019-07-10

## 2018-07-05 MED ORDER — DOXYCYCLINE HYCLATE 100 MG PO TABS: 100.0000 mg | ORAL_TABLET | Freq: Two times a day (BID) | ORAL | 1 refills | Status: DC

## 2018-07-05 NOTE — Progress Notes (Signed)
Chad Harris is a 67 y.o. male who presents for annual wellness visit and follow-up on chronic medical conditions.  He has has had some difficulty with BPH symptoms but none recently.  He has also had some erectile dysfunction but does have sildenafil at home for that.  He would like some medications to be prescribed; and ophthalmic medication that he occasionally use.  He also would like doxycycline and ibuprofen.  I have no problem with having these available to him.  He otherwise has no particular concerns or complaints.  He is now retired.  He and his wife are enjoying their retirement.  He did have a colonoscopy over 10 years ago which was normal.  Immunizations and Health Maintenance Immunization History  Administered Date(s) Administered  . Tdap 06/26/2016  . Zoster 11/11/2012   Health Maintenance Due  Topic Date Due  . Hepatitis C Screening  [birth year]  . HIV Screening  10/12/1967  . COLONOSCOPY  10/11/2002  . PNA vac Low Risk Adult (1 of 2 - PCV13) 10/11/2017  . INFLUENZA VACCINE  04/21/2018    Last colonoscopy: 2010 due soon Last PSA: 06-19-2016 Dentist:Six month ago Ophtho: feb 2019 Exercise: weights and biking , hiking  Other doctors caring for patient include: Jeffie Pollock. [place]burner. Newman.Amedeo Plenty  Advanced Directives:Yes copy asked for    Depression screen:  See questionnaire below.     Depression screen [facility] 2/9 07/05/2018 06/19/2016  Decreased Interest 0 0  Down, Depressed, Hopeless 0 0  PHQ - 2 Score 0 0    Fall Screen: See Questionaire below.   Fall Risk  07/05/2018 06/19/2016  Falls in the past year? No No    ADL screen:  See questionnaire below.  Functional Status Survey: Is the patient deaf or have difficulty hearing?: Yes Does the patient have difficulty seeing, even when wearing glasses/contacts?: No Does the patient have difficulty concentrating, remembering, or making decisions?: No Does the patient have difficulty walking or climbing stairs?:  No Does the patient have difficulty dressing or bathing?: No Does the patient have difficulty doing errands alone such as visiting a doctor's office or shopping?: No   Review of Systems  Constitutional: -, -unexpected weight change, -anorexia, -fatigue Allergy: -sneezing, -itching, -congestion Dermatology: denies changing moles, rash, lumps ENT: -runny nose, -ear pain, -sore throat,  Cardiology:  -chest pain, -palpitations, -orthopnea, Respiratory: -cough, -shortness of breath, -dyspnea on exertion, -wheezing,  Gastroenterology: -abdominal pain, -nausea, -vomiting, -diarrhea, -constipation, -dysphagia Hematology: -bleeding or bruising problems Musculoskeletal: -arthralgias, -myalgias, -joint swelling, -back pain, - Ophthalmology: -vision changes,  Urology: -dysuria, -difficulty urinating,  -urinary frequency, -urgency, incontinence Neurology: -, -numbness, , -memory loss, -falls, -dizziness    PHYSICAL EXAM:  General Appearance: Alert, cooperative, no distress, appears stated age Head: Normocephalic, without obvious abnormality, atraumatic Eyes: PERRL, conjunctiva/corneas clear, EOM's intact, fundi benign Ears: Normal TM's and external ear canals Nose: Nares normal, mucosa normal, no drainage or sinus   tenderness Throat: Lips, mucosa, and tongue normal; teeth and gums normal Neck: Supple, no lymphadenopathy, thyroid:no enlargement/tenderness/nodules; no carotid bruit or JVD Lungs: Clear to auscultation bilaterally without wheezes, rales or ronchi; respirations unlabored Heart: Regular rate and rhythm, S1 and S2 normal, no murmur, rub or gallop Abdomen: Soft, non-tender, nondistended, normoactive bowel sounds, no masses, no hepatosplenomegaly Extremities: No clubbing, cyanosis or edema Pulses: 2+ and symmetric all extremities Skin: Skin color, texture, turgor normal, no rashes or lesions Lymph nodes: Cervical, supraclavicular, and axillary nodes normal Neurologic: CNII-XII  intact, normal strength, sensation and gait;  reflexes 2+ and symmetric throughout   Psych: Normal mood, affect, hygiene and grooming  ASSESSMENT/PLAN: Routine general medical examination at a health care facility - Plan: CBC with Differential/Platelet, Comprehensive metabolic panel, Lipid panel  BPH associated with nocturia  Erectile dysfunction, unspecified erectile dysfunction type  Screening for lipid disorders - Plan: Lipid panel  Encounter for hepatitis C screening test for low risk patient - Plan: Hepatitis C antibody  Need for vaccination against Streptococcus pneumoniae - Plan: Pneumococcal conjugate vaccine 13-valent  Screening for colon cancer - Plan: Cologuard  Need for influenza vaccination - Plan: Flu vaccine HIGH DOSE PF (Fluzone High dose)  Discussed PSA screening (risks/benefits), recommended at least 30 minutes of aerobic activity at least 5 days/week; healthy diet and alcohol recommendations (less than or equal to 2 drinks/day) reviewed;Immunization recommendations discussed.  Colonoscopy recommendations reviewed.   Medicare Attestation I have personally reviewed: The patient's medical and social history Their use of alcohol, tobacco or illicit drugs Their current medications and supplements The patient's functional ability including ADLs,fall risks, home safety risks, cognitive, and hearing and visual impairment Diet and physical activities Evidence for depression or mood disorders  The patient's weight, height, and BMI have been recorded in the chart.  I have made referrals, counseling, and provided education to the patient based on review of the above and I have provided the patient with a written personalized care plan for preventive services.     Chad Alexanders, MD   07/05/2018

## 2018-07-06 ENCOUNTER — Encounter: Payer: Self-pay | Admitting: Family Medicine

## 2018-07-06 LAB — HEPATITIS C ANTIBODY: Hep C Virus Ab: <0.1 s/co ratio (ref 0.0–0.9)

## 2018-07-12 DIAGNOSIS — Z1211 Encounter for screening for malignant neoplasm of colon: Secondary | ICD-10-CM | POA: Diagnosis not present

## 2018-07-18 DIAGNOSIS — R69 Illness, unspecified: Secondary | ICD-10-CM | POA: Diagnosis not present

## 2018-07-18 LAB — COLOGUARD: COLOGUARD: NEGATIVE

## 2018-07-19 ENCOUNTER — Telehealth: Payer: Self-pay

## 2018-07-19 DIAGNOSIS — R69 Illness, unspecified: Secondary | ICD-10-CM | POA: Diagnosis not present

## 2018-07-19 NOTE — Telephone Encounter (Signed)
Pt was given results. [facility]

## 2018-07-28 DIAGNOSIS — M79671 Pain in right foot: Secondary | ICD-10-CM | POA: Diagnosis not present

## 2018-11-08 DIAGNOSIS — M542 Cervicalgia: Secondary | ICD-10-CM | POA: Diagnosis not present

## 2018-11-08 DIAGNOSIS — M25512 Pain in left shoulder: Secondary | ICD-10-CM | POA: Diagnosis not present

## 2018-11-08 DIAGNOSIS — M25511 Pain in right shoulder: Secondary | ICD-10-CM | POA: Diagnosis not present

## 2018-11-15 DIAGNOSIS — M25512 Pain in left shoulder: Secondary | ICD-10-CM | POA: Diagnosis not present

## 2018-11-21 DIAGNOSIS — M75101 Unspecified rotator cuff tear or rupture of right shoulder, not specified as traumatic: Secondary | ICD-10-CM | POA: Diagnosis not present

## 2018-12-06 DIAGNOSIS — M542 Cervicalgia: Secondary | ICD-10-CM | POA: Diagnosis not present

## 2018-12-06 DIAGNOSIS — G959 Disease of spinal cord, unspecified: Secondary | ICD-10-CM | POA: Diagnosis not present

## 2018-12-28 DIAGNOSIS — M4722 Other spondylosis with radiculopathy, cervical region: Secondary | ICD-10-CM | POA: Diagnosis not present

## 2019-02-02 ENCOUNTER — Telehealth: Payer: Self-pay | Admitting: Family Medicine

## 2019-02-02 DIAGNOSIS — M4722 Other spondylosis with radiculopathy, cervical region: Secondary | ICD-10-CM | POA: Diagnosis not present

## 2019-02-02 MED ORDER — NITROGLYCERIN 0.4 MG SL SUBL: 0.4000 mg | SUBLINGUAL_TABLET | SUBLINGUAL | 3 refills | Status: DC | PRN

## 2019-02-02 MED ORDER — KETOCONAZOLE 2 % EX CREA: 1.0000 "application " | TOPICAL_CREAM | Freq: Every day | CUTANEOUS | 1 refills | Status: DC

## 2019-02-02 NOTE — Telephone Encounter (Signed)
Pt called and states he told wrong pharmacy. Please send to [facility] [address]

## 2019-02-02 NOTE — Telephone Encounter (Signed)
   Needs rx's for   Sublingual nitroglycerin (small bottle to have to take with him on hiking trips with some older gentlemen)  ketaconozole 2%  30 gram tube  With   1 refill use BID   [facility] [address]

## 2019-02-10 DIAGNOSIS — M4722 Other spondylosis with radiculopathy, cervical region: Secondary | ICD-10-CM | POA: Diagnosis not present

## 2019-02-22 DIAGNOSIS — M4722 Other spondylosis with radiculopathy, cervical region: Secondary | ICD-10-CM | POA: Diagnosis not present

## 2019-03-02 DIAGNOSIS — M4722 Other spondylosis with radiculopathy, cervical region: Secondary | ICD-10-CM | POA: Diagnosis not present

## 2019-03-09 DIAGNOSIS — M4722 Other spondylosis with radiculopathy, cervical region: Secondary | ICD-10-CM | POA: Diagnosis not present

## 2019-03-16 DIAGNOSIS — M4722 Other spondylosis with radiculopathy, cervical region: Secondary | ICD-10-CM | POA: Diagnosis not present

## 2019-03-27 DIAGNOSIS — M4722 Other spondylosis with radiculopathy, cervical region: Secondary | ICD-10-CM | POA: Diagnosis not present

## 2019-03-29 ENCOUNTER — Telehealth: Payer: Self-pay | Admitting: Family Medicine

## 2019-03-29 NOTE — Telephone Encounter (Signed)
yes

## 2019-03-29 NOTE — Telephone Encounter (Signed)
Pt called & wants antibody test, he is traveling next Thursday Advised pt he would have to check with ins for coverage.  He has no symptoms, screening done.  Is this ok?

## 2019-03-29 NOTE — Telephone Encounter (Signed)
appt made

## 2019-03-31 DIAGNOSIS — Z20828 Contact with and (suspected) exposure to other viral communicable diseases: Secondary | ICD-10-CM | POA: Diagnosis not present

## 2019-04-03 ENCOUNTER — Other Ambulatory Visit: Payer: Medicare HMO

## 2019-04-03 ENCOUNTER — Other Ambulatory Visit: Payer: Self-pay

## 2019-04-03 DIAGNOSIS — Z20828 Contact with and (suspected) exposure to other viral communicable diseases: Secondary | ICD-10-CM

## 2019-04-03 DIAGNOSIS — Z20822 Contact with and (suspected) exposure to covid-19: Secondary | ICD-10-CM

## 2019-04-03 DIAGNOSIS — M4722 Other spondylosis with radiculopathy, cervical region: Secondary | ICD-10-CM | POA: Diagnosis not present

## 2019-04-04 LAB — SAR COV2 SEROLOGY (COVID19)AB(IGG),IA: SARS-CoV-2 Ab, IgG: NEGATIVE

## 2019-07-05 DIAGNOSIS — M4722 Other spondylosis with radiculopathy, cervical region: Secondary | ICD-10-CM | POA: Diagnosis not present

## 2019-07-10 ENCOUNTER — Ambulatory Visit (INDEPENDENT_AMBULATORY_CARE_PROVIDER_SITE_OTHER): Payer: Medicare HMO | Admitting: Family Medicine

## 2019-07-10 ENCOUNTER — Other Ambulatory Visit: Payer: Self-pay

## 2019-07-10 VITALS — BP 112/78 | HR 64 | Temp 95.5°F | Ht 74.0 in | Wt 174.0 lb

## 2019-07-10 DIAGNOSIS — Z0000 Encounter for general adult medical examination without abnormal findings: Secondary | ICD-10-CM

## 2019-07-10 DIAGNOSIS — N529 Male erectile dysfunction, unspecified: Secondary | ICD-10-CM | POA: Diagnosis not present

## 2019-07-10 DIAGNOSIS — N401 Enlarged prostate with lower urinary tract symptoms: Secondary | ICD-10-CM | POA: Diagnosis not present

## 2019-07-10 DIAGNOSIS — Z13220 Encounter for screening for lipoid disorders: Secondary | ICD-10-CM

## 2019-07-10 DIAGNOSIS — Z125 Encounter for screening for malignant neoplasm of prostate: Secondary | ICD-10-CM

## 2019-07-10 DIAGNOSIS — Z23 Encounter for immunization: Secondary | ICD-10-CM

## 2019-07-10 DIAGNOSIS — R351 Nocturia: Secondary | ICD-10-CM | POA: Diagnosis not present

## 2019-07-10 MED ORDER — IBUPROFEN 600 MG PO TABS: 600.0000 mg | ORAL_TABLET | Freq: Four times a day (QID) | ORAL | 1 refills | Status: DC | PRN

## 2019-07-10 NOTE — Patient Instructions (Signed)
  Mr. Chad Harris , Thank you for taking time to come for your Medicare Wellness Visit. I appreciate your ongoing commitment to your health goals. Please review the following plan we discussed and let me know if I can assist you in the future.   These are the goals we discussed: Get shingrix  This is a list of the screening recommended for you and due dates:  Health Maintenance  Topic Date Due  . Flu Shot  04/22/2019  . Pneumonia vaccines (2 of 2 - PPSV23) 07/06/2019  . Cologuard (Stool DNA test)  07/18/2021  . Tetanus Vaccine  06/26/2026  .  Hepatitis C: One time screening is recommended by Center for Disease Control  (CDC) for  adults born from [birth year] through 1963.   Completed

## 2019-07-10 NOTE — Progress Notes (Signed)
Chad Harris is a 68 y.o. male who presents for annual wellness visit and follow-up on chronic medical conditions.  He has no particular concerns or complaints.  He has now retired and very much enjoying it.  He was on finasteride in the past but presently has not taking it.  He does have some nocturia but is not overly concerned about it.  He also has some erectile dysfunction and does use sildenafil, but on an as-needed basis.  Otherwise he has no concerns or complaints.  Family and social history was reviewed.  He does keep himself very physically active.  His marriage is going quite well.   Immunizations and Health Maintenance Immunization History  Administered Date(s) Administered  . Influenza, High Dose Seasonal PF 07/05/2018  . Pneumococcal Conjugate-13 07/05/2018  . Tdap 06/26/2016  . Zoster 11/11/2012   Health Maintenance Due  Topic Date Due  . INFLUENZA VACCINE  04/22/2019  . PNA vac Low Risk Adult (2 of 2 - PPSV23) 07/06/2019    Last colonoscopy:  07-18-18 cologuard Last PSA:06-19-16 Dentist: 05/2019 Ophtho: 10/2017 Exercise: Hiking three or four time a week  Other doctors caring for patient include: [place] Dr. Ellene Route, Dr [name] ortho, Dr. Roni [facility]  Advanced Directives: not have on file  Does Patient Have a Medical Advance Directive?: No Would patient like information on creating a medical advance directive?: Yes (MAU/Ambulatory/Procedural Areas - Information given)  Depression screen:  See questionnaire below.     Depression screen [facility] 2/9 07/10/2019 07/05/2018 06/19/2016  Decreased Interest 0 0 0  Down, Depressed, Hopeless 0 0 0  PHQ - 2 Score 0 0 0    Fall Screen: See Questionaire below.   Fall Risk  07/10/2019 07/05/2018 06/19/2016  Falls in the past year? 0 No No    ADL screen:  See questionnaire below.  Functional Status Survey: Is the patient deaf or have difficulty hearing?: Yes(hearing aide) Does the patient have difficulty seeing, even when  wearing glasses/contacts?: No Does the patient have difficulty concentrating, remembering, or making decisions?: No Does the patient have difficulty walking or climbing stairs?: No Does the patient have difficulty dressing or bathing?: No Does the patient have difficulty doing errands alone such as visiting a doctor's office or shopping?: No   Review of Systems  Constitutional: -, -unexpected weight change, -anorexia, -fatigue Allergy: -sneezing, -itching, -congestion Dermatology: denies changing moles, rash, lumps ENT: -runny nose, -ear pain, -sore throat,  Cardiology:  -chest pain, -palpitations, -orthopnea, Respiratory: -cough, -shortness of breath, -dyspnea on exertion, -wheezing,  Gastroenterology: -abdominal pain, -nausea, -vomiting, -diarrhea, -constipation, -dysphagia Hematology: -bleeding or bruising problems Musculoskeletal: -arthralgias, -myalgias, -joint swelling, -back pain, - Ophthalmology: -vision changes,  Urology: -dysuria, -difficulty urinating,  -urinary frequency, -urgency, incontinence Neurology: -, -numbness, , -memory loss, -falls, -dizziness    PHYSICAL EXAM:  BP 112/78 (BP Location: Left Arm, Patient Position: Sitting)   Pulse 64   Temp (!) 95.5 F (35.3 C)   Ht 6\' 2"  (1.88 m)   Wt 174 lb (78.9 kg)   SpO2 98%   BMI 22.34 kg/m   General Appearance: Alert, cooperative, no distress, appears stated age Head: Normocephalic, without obvious abnormality, atraumatic Eyes: PERRL, conjunctiva/corneas clear, EOM's intact, fundi benign Ears: Normal TM's and external ear canals Nose: Nares normal, mucosa normal, no drainage or sinus   tenderness Throat: Lips, mucosa, and tongue normal; teeth and gums normal Neck: Supple, no lymphadenopathy, thyroid:no enlargement/tenderness/nodules; no carotid bruit or JVD Lungs: Clear to auscultation bilaterally without wheezes,  rales or ronchi; respirations unlabored Heart: Regular rate and rhythm, S1 and S2 normal, no  murmur, rub or gallop Abdomen: Soft, non-tender, nondistended, normoactive bowel sounds, no masses, no hepatosplenomegaly Extremities: No clubbing, cyanosis or edema Pulses: 2+ and symmetric all extremities Skin: Skin color, texture, turgor normal, no rashes or lesions Lymph nodes: Cervical, supraclavicular, and axillary nodes normal Neurologic: CNII-XII intact, normal strength, sensation and gait; reflexes 2+ and symmetric throughout   Psych: Normal mood, affect, hygiene and grooming  ASSESSMENT/PLAN: Routine general medical examination at a health care facility - Plan: CBC with Differential/Platelet, Comprehensive metabolic panel, Lipid panel  BPH associated with nocturia - Plan: CBC with Differential/Platelet, Comprehensive metabolic panel  Erectile dysfunction, unspecified erectile dysfunction type - Plan: CBC with Differential/Platelet, Comprehensive metabolic panel  Screening for lipid disorders - Plan: Lipid panel  Need for influenza vaccination - Plan: Flu Vaccine QUAD High Dose(Fluad)  Need for vaccination against Streptococcus pneumoniae - Plan: Pneumococcal polysaccharide vaccine 23-valent greater than or equal to 2yo subcutaneous/IM   recommended at least 30 minutes of aerobic activity at least 5 days/week; proper sunscreen use reviewed; healthy diet and alcohol recommendations (less than or equal to 2 drinks/day) reviewed; regular seatbelt use; changing batteries in smoke detectors. Immunization recommendations discussed.  Colonoscopy recommendations reviewed.   Medicare Attestation I have personally reviewed: The patient's medical and social history Their use of alcohol, tobacco or illicit drugs Their current medications and supplements The patient's functional ability including ADLs,fall risks, home safety risks, cognitive, and hearing and visual impairment Diet and physical activities Evidence for depression or mood disorders  The patient's weight, height, and BMI  have been recorded in the chart.  I have made referrals, counseling, and provided education to the patient based on review of the above and I have provided the patient with a written personalized care plan for preventive services.     Jill Alexanders, MD   07/10/2019

## 2019-07-11 LAB — CBC WITH DIFFERENTIAL/PLATELET
Basophils Absolute: 0 10*3/uL (ref 0.0–0.2)
Basos: 1 %
EOS (ABSOLUTE): 0.1 10*3/uL (ref 0.0–0.4)
Eos: 2 %
Hematocrit: 45.7 % (ref 37.5–51.0)
Hemoglobin: 16 g/dL (ref 13.0–17.7)
Immature Grans (Abs): 0 10*3/uL (ref 0.0–0.1)
Immature Granulocytes: 0 %
Lymphocytes Absolute: 1.7 10*3/uL (ref 0.7–3.1)
Lymphs: 33 %
MCH: 30.5 pg (ref 26.6–33.0)
MCHC: 35 g/dL (ref 31.5–35.7)
MCV: 87 fL (ref 79–97)
Monocytes Absolute: 0.5 10*3/uL (ref 0.1–0.9)
Monocytes: 10 %
Neutrophils Absolute: 2.7 10*3/uL (ref 1.4–7.0)
Neutrophils: 54 %
Platelets: 195 10*3/uL (ref 150–450)
RBC: 5.25 x10E6/uL (ref 4.14–5.80)
RDW: 12.5 % (ref 11.6–15.4)
WBC: 5.1 10*3/uL (ref 3.4–10.8)

## 2019-07-11 LAB — LIPID PANEL
Chol/HDL Ratio: 1.8 ratio (ref 0.0–5.0)
Cholesterol, Total: 117 mg/dL (ref 100–199)
HDL: 66 mg/dL (ref >39)
LDL Chol Calc (NIH): 37 mg/dL (ref 0–99)
Triglycerides: 67 mg/dL (ref 0–149)
VLDL Cholesterol Cal: 14 mg/dL (ref 5–40)

## 2019-07-11 LAB — COMPREHENSIVE METABOLIC PANEL
ALT: 39 IU/L (ref 0–44)
AST: 49 IU/L — ABNORMAL HIGH (ref 0–40)
Albumin/Globulin Ratio: 2 (ref 1.2–2.2)
Albumin: 4.4 g/dL (ref 3.8–4.8)
Alkaline Phosphatase: 52 IU/L (ref 39–117)
BUN/Creatinine Ratio: 14 (ref 10–24)
BUN: 15 mg/dL (ref 8–27)
Bilirubin Total: 0.8 mg/dL (ref 0.0–1.2)
CO2: 24 mmol/L (ref 20–29)
Calcium: 9.4 mg/dL (ref 8.6–10.2)
Chloride: 100 mmol/L (ref 96–106)
Creatinine, Ser: 1.09 mg/dL (ref 0.76–1.27)
GFR calc Af Amer: 81 mL/min/{1.73_m2} (ref >59)
GFR calc non Af Amer: 70 mL/min/{1.73_m2} (ref >59)
Globulin, Total: 2.2 g/dL (ref 1.5–4.5)
Glucose: 87 mg/dL (ref 65–99)
Potassium: 4.9 mmol/L (ref 3.5–5.2)
Sodium: 140 mmol/L (ref 134–144)
Total Protein: 6.6 g/dL (ref 6.0–8.5)

## 2019-07-11 LAB — PSA: Prostate Specific Ag, Serum: 2.6 ng/mL (ref 0.0–4.0)

## 2019-10-21 ENCOUNTER — Ambulatory Visit: Payer: Self-pay

## 2019-10-26 ENCOUNTER — Ambulatory Visit: Payer: Medicare HMO | Attending: Internal Medicine

## 2019-10-26 ENCOUNTER — Ambulatory Visit: Payer: Self-pay

## 2019-10-26 DIAGNOSIS — Z23 Encounter for immunization: Secondary | ICD-10-CM | POA: Insufficient documentation

## 2019-10-26 NOTE — Progress Notes (Signed)
   Covid-19 Vaccination Clinic  Name:  Chad Harris    MRN: [id number] DOB: 1951-07-09  10/26/2019  Mr. [name] was observed post Covid-19 immunization for 15 minutes without incidence. He was provided with Vaccine Information Sheet and instruction to access the V-Safe system.   Mr. [name] was instructed to call 911 with any severe reactions post vaccine: Marland Kitchen Difficulty breathing  . Swelling of your face and throat  . A fast heartbeat  . A bad rash all over your body  . Dizziness and weakness    Immunizations Administered    Name Date Dose VIS Date Route   Pfizer COVID-19 Vaccine 10/26/2019 10:03 AM 0.3 mL 09/01/2019 Intramuscular   Manufacturer: [place]   Lot: [id number]   [place]: [id number]

## 2019-11-20 ENCOUNTER — Ambulatory Visit: Payer: Medicare HMO | Attending: Internal Medicine

## 2019-11-20 DIAGNOSIS — Z23 Encounter for immunization: Secondary | ICD-10-CM

## 2019-11-20 NOTE — Progress Notes (Signed)
   Covid-19 Vaccination Clinic  Name:  Chad Harris    MRN: [id number] DOB: 10-28-1950  11/20/2019  Mr. Chad Harris was observed post Covid-19 immunization for 15 minutes without incidence. He was provided with Vaccine Information Sheet and instruction to access the V-Safe system.   Mr. Chad Harris was instructed to call 911 with any severe reactions post vaccine: Marland Kitchen Difficulty breathing  . Swelling of your face and throat  . A fast heartbeat  . A bad rash all over your body  . Dizziness and weakness    Immunizations Administered    Name Date Dose VIS Date Route   Pfizer COVID-19 Vaccine 11/20/2019  9:38 AM 0.3 mL 09/01/2019 Intramuscular   Manufacturer: [place]   Lot: [id number]   [place]: [id number]

## 2019-11-21 DIAGNOSIS — R69 Illness, unspecified: Secondary | ICD-10-CM | POA: Diagnosis not present

## 2020-04-15 DIAGNOSIS — S2242XA Multiple fractures of ribs, left side, initial encounter for closed fracture: Secondary | ICD-10-CM | POA: Diagnosis not present

## 2020-04-15 DIAGNOSIS — R0781 Pleurodynia: Secondary | ICD-10-CM | POA: Diagnosis not present

## 2020-04-15 DIAGNOSIS — S2232XA Fracture of one rib, left side, initial encounter for closed fracture: Secondary | ICD-10-CM | POA: Diagnosis not present

## 2020-04-30 DIAGNOSIS — H90A31 Mixed conductive and sensorineural hearing loss, unilateral, right ear with restricted hearing on the contralateral side: Secondary | ICD-10-CM | POA: Diagnosis not present

## 2020-04-30 DIAGNOSIS — H90A22 Sensorineural hearing loss, unilateral, left ear, with restricted hearing on the contralateral side: Secondary | ICD-10-CM | POA: Diagnosis not present

## 2020-04-30 DIAGNOSIS — H52203 Unspecified astigmatism, bilateral: Secondary | ICD-10-CM | POA: Diagnosis not present

## 2020-04-30 DIAGNOSIS — H04122 Dry eye syndrome of left lacrimal gland: Secondary | ICD-10-CM | POA: Diagnosis not present

## 2020-04-30 DIAGNOSIS — H5203 Hypermetropia, bilateral: Secondary | ICD-10-CM | POA: Diagnosis not present

## 2020-05-29 DIAGNOSIS — R69 Illness, unspecified: Secondary | ICD-10-CM | POA: Diagnosis not present

## 2020-06-17 DIAGNOSIS — R69 Illness, unspecified: Secondary | ICD-10-CM | POA: Diagnosis not present

## 2020-07-15 ENCOUNTER — Encounter: Payer: Self-pay | Admitting: Family Medicine

## 2020-07-15 ENCOUNTER — Ambulatory Visit (INDEPENDENT_AMBULATORY_CARE_PROVIDER_SITE_OTHER): Payer: Medicare HMO | Admitting: Family Medicine

## 2020-07-15 ENCOUNTER — Other Ambulatory Visit: Payer: Self-pay

## 2020-07-15 VITALS — BP 110/76 | HR 64 | Temp 98.0°F | Ht 72.5 in | Wt 183.4 lb

## 2020-07-15 DIAGNOSIS — Z13220 Encounter for screening for lipoid disorders: Secondary | ICD-10-CM | POA: Diagnosis not present

## 2020-07-15 DIAGNOSIS — N529 Male erectile dysfunction, unspecified: Secondary | ICD-10-CM

## 2020-07-15 DIAGNOSIS — Z0000 Encounter for general adult medical examination without abnormal findings: Secondary | ICD-10-CM

## 2020-07-15 DIAGNOSIS — N401 Enlarged prostate with lower urinary tract symptoms: Secondary | ICD-10-CM | POA: Diagnosis not present

## 2020-07-15 DIAGNOSIS — R351 Nocturia: Secondary | ICD-10-CM

## 2020-07-15 LAB — LIPID PANEL

## 2020-07-15 NOTE — Progress Notes (Signed)
Chad Harris is a 69 y.o. male who presents for annual wellness visit and follow-up on chronic medical conditions.  He has no particular concerns or complaints.  He is now retired and enjoying his retirement.  He does have a history of erectile dysfunction but apparently this is not causing any major problems at the present time.  There is also history of BPH but he has no present urinary symptoms other than nocturia.  He does take multivitamins but is on no prescription medications.  His immunizations are up-to-date otherwise his family and social history is unremarkable.   Immunizations and Health Maintenance Immunization History  Administered Date(s) Administered  . Fluad Quad(high Dose 65+) 07/10/2019  . Influenza, High Dose Seasonal PF 07/05/2018  . PFIZER SARS-COV-2 Vaccination 10/26/2019, 11/20/2019  . Pneumococcal Conjugate-13 07/05/2018  . Pneumococcal Polysaccharide-23 07/10/2019  . Tdap 06/26/2016  . Zoster 11/11/2012   Health Maintenance Due  Topic Date Due  . INFLUENZA VACCINE  04/21/2020    Last colonoscopy: cologurad 07/05/18 Last PSA: 07/10/19 Dentist: Q six months  Ophtho: Q year Exercise: walking and weights 5 days a week for at least 1hour  Other doctors caring for patient include:  [name]  Advanced Directives: Does Patient Have a Medical Advance Directive?: Yes Type of Advance Directive: Living will, Healthcare Power of Attorney, Out of facility DNR (pink MOST or yellow form) Does patient want to make changes to medical advance directive?: No - Patient declined [place] in Chart?: Yes - validated most recent copy scanned in chart (See row information)  Depression screen:  See questionnaire below.     Depression screen [facility] 2/9 07/15/2020 07/10/2019 07/05/2018 06/19/2016  Decreased Interest 0 0 0 0  Down, Depressed, Hopeless 0 0 0 0  PHQ - 2 Score 0 0 0 0    Fall Screen: See Questionaire below.   Fall Risk  07/15/2020  07/10/2019 07/05/2018 06/19/2016  Falls in the past year? 0 0 No No  Risk for fall due to : No Fall Risks - - -    ADL screen:  See questionnaire below.  Functional Status Survey: Is the patient deaf or have difficulty hearing?: Yes (right ear) Does the patient have difficulty seeing, even when wearing glasses/contacts?: No Does the patient have difficulty concentrating, remembering, or making decisions?: No Does the patient have difficulty walking or climbing stairs?: No Does the patient have difficulty dressing or bathing?: No Does the patient have difficulty doing errands alone such as visiting a doctor's office or shopping?: No   Review of Systems  Constitutional: -, -unexpected weight change, -anorexia, -fatigue Allergy: -sneezing, -itching, -congestion Dermatology: denies changing moles, rash, lumps ENT: -runny nose, -ear pain, -sore throat,  Cardiology:  -chest pain, -palpitations, -orthopnea, Respiratory: -cough, -shortness of breath, -dyspnea on exertion, -wheezing,  Gastroenterology: -abdominal pain, -nausea, -vomiting, -diarrhea, -constipation, -dysphagia Hematology: -bleeding or bruising problems Musculoskeletal: -arthralgias, -myalgias, -joint swelling, -back pain, - Ophthalmology: -vision changes,  Urology: -dysuria, -difficulty urinating,  -urinary frequency, -urgency, incontinence Neurology: -, -numbness, , -memory loss, -falls, -dizziness    PHYSICAL EXAM:   General Appearance: Alert, cooperative, no distress, appears stated age Head: Normocephalic, without obvious abnormality, atraumatic Eyes: PERRL, conjunctiva/corneas clear, EOM's intact. Ears: Normal TM's and external ear canals Nose: Nares normal, mucosa normal, no drainage or sinus   tenderness Throat: Lips, mucosa, and tongue normal; teeth and gums normal Neck: Supple, no lymphadenopathy, thyroid:no enlargement/tenderness/nodules; no carotid bruit or JVD Lungs: Clear to auscultation bilaterally without  wheezes, rales or ronchi; respirations unlabored Heart: Regular rate and rhythm, S1 and S2 normal, no murmur, rub or gallop Abdomen: Soft, non-tender, nondistended, normoactive bowel sounds, no masses, no hepatosplenomegaly Extremities: No clubbing, cyanosis or edema Pulses: 2+ and symmetric all extremities Skin: Skin color, texture, turgor normal, no rashes or lesions Lymph nodes: Cervical, supraclavicular, and axillary nodes normal Neurologic: CNII-XII intact, normal strength, sensation and gait; reflexes 2+ and symmetric throughout   Psych: Normal mood, affect, hygiene and grooming  ASSESSMENT/PLAN: Routine general medical examination at a health care facility - Plan: CBC with Differential/Platelet, Comprehensive metabolic panel, Lipid panel  BPH associated with nocturia  Erectile dysfunction, unspecified erectile dysfunction type - Plan: CBC with Differential/Platelet, Comprehensive metabolic panel  Screening for lipid disorders - Plan: Lipid panel Recommend he take a good multivitamin on a daily basis otherwise no further intervention needed. Discussed PSA screening (risks/benefits),Immunization recommendations discussed.  Colonoscopy recommendations reviewed.   Medicare Attestation I have personally reviewed: The patient's medical and social history Their use of alcohol, tobacco or illicit drugs Their current medications and supplements The patient's functional ability including ADLs,fall risks, home safety risks, cognitive, and hearing and visual impairment Diet and physical activities Evidence for depression or mood disorders  The patient's weight, height, and BMI have been recorded in the chart.  I have made referrals, counseling, and provided education to the patient based on review of the above and I have provided the patient with a written personalized care plan for preventive services.     Jill Alexanders, MD   07/15/2020

## 2020-07-16 LAB — COMPREHENSIVE METABOLIC PANEL
ALT: 20 IU/L (ref 0–44)
AST: 19 IU/L (ref 0–40)
Albumin/Globulin Ratio: 2 (ref 1.2–2.2)
Albumin: 4.5 g/dL (ref 3.8–4.8)
Alkaline Phosphatase: 59 IU/L (ref 44–121)
BUN/Creatinine Ratio: 15 (ref 10–24)
BUN: 16 mg/dL (ref 8–27)
Bilirubin Total: 0.7 mg/dL (ref 0.0–1.2)
CO2: 24 mmol/L (ref 20–29)
Calcium: 9.6 mg/dL (ref 8.6–10.2)
Chloride: 101 mmol/L (ref 96–106)
Creatinine, Ser: 1.06 mg/dL (ref 0.76–1.27)
GFR calc Af Amer: 84 mL/min/{1.73_m2} (ref >59)
GFR calc non Af Amer: 72 mL/min/{1.73_m2} (ref >59)
Globulin, Total: 2.3 g/dL (ref 1.5–4.5)
Glucose: 94 mg/dL (ref 65–99)
Potassium: 4.8 mmol/L (ref 3.5–5.2)
Sodium: 138 mmol/L (ref 134–144)
Total Protein: 6.8 g/dL (ref 6.0–8.5)

## 2020-07-16 LAB — LIPID PANEL
Chol/HDL Ratio: 2.5 ratio (ref 0.0–5.0)
Cholesterol, Total: 133 mg/dL (ref 100–199)
HDL: 54 mg/dL (ref >39)
LDL Chol Calc (NIH): 56 mg/dL (ref 0–99)
Triglycerides: 131 mg/dL (ref 0–149)
VLDL Cholesterol Cal: 23 mg/dL (ref 5–40)

## 2020-07-16 LAB — CBC WITH DIFFERENTIAL/PLATELET
Basophils Absolute: 0.1 10*3/uL (ref 0.0–0.2)
Basos: 1 %
EOS (ABSOLUTE): 0.1 10*3/uL (ref 0.0–0.4)
Eos: 2 %
Hematocrit: 48.2 % (ref 37.5–51.0)
Hemoglobin: 16.7 g/dL (ref 13.0–17.7)
Immature Grans (Abs): 0 10*3/uL (ref 0.0–0.1)
Immature Granulocytes: 0 %
Lymphocytes Absolute: 1.6 10*3/uL (ref 0.7–3.1)
Lymphs: 27 %
MCH: 30.6 pg (ref 26.6–33.0)
MCHC: 34.6 g/dL (ref 31.5–35.7)
MCV: 88 fL (ref 79–97)
Monocytes Absolute: 0.4 10*3/uL (ref 0.1–0.9)
Monocytes: 7 %
Neutrophils Absolute: 3.6 10*3/uL (ref 1.4–7.0)
Neutrophils: 63 %
Platelets: 174 10*3/uL (ref 150–450)
RBC: 5.46 x10E6/uL (ref 4.14–5.80)
RDW: 12.6 % (ref 11.6–15.4)
WBC: 5.8 10*3/uL (ref 3.4–10.8)

## 2020-07-23 DIAGNOSIS — D485 Neoplasm of uncertain behavior of skin: Secondary | ICD-10-CM | POA: Diagnosis not present

## 2020-07-23 DIAGNOSIS — L570 Actinic keratosis: Secondary | ICD-10-CM | POA: Diagnosis not present

## 2020-07-23 DIAGNOSIS — L578 Other skin changes due to chronic exposure to nonionizing radiation: Secondary | ICD-10-CM | POA: Diagnosis not present

## 2020-07-23 DIAGNOSIS — C44519 Basal cell carcinoma of skin of other part of trunk: Secondary | ICD-10-CM | POA: Diagnosis not present

## 2020-07-23 DIAGNOSIS — L814 Other melanin hyperpigmentation: Secondary | ICD-10-CM | POA: Diagnosis not present

## 2020-07-23 DIAGNOSIS — L905 Scar conditions and fibrosis of skin: Secondary | ICD-10-CM | POA: Diagnosis not present

## 2020-09-02 ENCOUNTER — Other Ambulatory Visit: Payer: Self-pay

## 2020-09-02 ENCOUNTER — Ambulatory Visit (INDEPENDENT_AMBULATORY_CARE_PROVIDER_SITE_OTHER): Payer: Medicare HMO | Admitting: Otolaryngology

## 2020-09-02 VITALS — Temp 97.9°F

## 2020-09-02 DIAGNOSIS — J029 Acute pharyngitis, unspecified: Secondary | ICD-10-CM

## 2020-09-02 NOTE — Progress Notes (Signed)
HPI: Chad Harris is a 69 y.o. male who presents for evaluation of throat symptoms.  Patient had sensation of swelling and/or foreign body in his throat more on the right side.  He almost felt like a piece of food was stuck in his throat.  It has been much better over the past 5 days.  He has had no hoarseness and no difficulty eating.  No past medical history on file. No past surgical history on file. Social History   Socioeconomic History  . Marital status: Married    Spouse name: Not on file  . Number of children: Not on file  . Years of education: Not on file  . Highest education level: Not on file  Occupational History  . Not on file  Tobacco Use  . Smoking status: Never Smoker  . Smokeless tobacco: Never Used  Vaping Use  . Vaping Use: Never used  Substance and Sexual Activity  . Alcohol use: Yes    Alcohol/week: 10.0 standard drinks    Types: 10 Glasses of wine per week  . Drug use: No  . Sexual activity: Yes  Other Topics Concern  . Not on file  Social History Narrative  . Not on file   Social Determinants of Health   Financial Resource Strain: Not on file  Food Insecurity: Not on file  Transportation Needs: Not on file  Physical Activity: Not on file  Stress: Not on file  Social Connections: Not on file   No family history on file. No Known Allergies Prior to Admission medications   Medication Sig Start Date End Date Taking? Authorizing Provider  aspirin 81 MG tablet Take 81 mg by mouth daily.   Yes [provider]  calcium carbonate 200 MG capsule Take 250 mg by mouth 2 (two) times daily with a meal.   Yes [provider]  doxycycline (VIBRA-TABS) 100 MG tablet Take 1 tablet (100 mg total) by mouth 2 (two) times daily. 07/05/18  Yes Denita Lung, MD  ibuprofen (ADVIL) 600 MG tablet Take 1 tablet (600 mg total) by mouth every 6 (six) hours as needed. 07/10/19  Yes Denita Lung, MD  ketoconazole (NIZORAL) 2 % cream Apply 1  application topically daily. 02/02/19  Yes Denita Lung, MD  Multiple Vitamins-Minerals (MULTIVITAMIN WITH MINERALS) tablet Take 1 tablet by mouth daily.   Yes [provider]  nitroGLYCERIN (NITROSTAT) 0.4 MG SL tablet Place 1 tablet (0.4 mg total) under the tongue every 5 (five) minutes as needed for chest pain. 02/02/19  Yes Denita Lung, MD  ofloxacin (OCUFLOX) 0.3 % ophthalmic solution Place 1 drop into both eyes 4 (four) times daily. 07/05/18  Yes Denita Lung, MD     Positive ROS: Otherwise negative  All other systems have been reviewed and were otherwise negative with the exception of those mentioned in the HPI and as above.  Physical Exam: Constitutional: Alert, well-appearing, no acute distress Ears: External ears without lesions or tenderness. Ear canals are clear bilaterally with intact, clear TMs.  Nasal: External nose without lesions. Clear nasal passages bilaterally. Oral: Lips and gums without lesions. Tongue and palate mucosa without lesions. Posterior oropharynx clear.  Patient is status post tonsillectomy.  Indirect laryngoscopy revealed a clear base of tongue.  The epiglottis and vocal cords were clear with normal vocal cord mobility.  Piriform sinuses were clear bilaterally.  Palpation of the base of tongue was soft. Neck: No palpable adenopathy or masses.  Do not  appreciate any adenopathy on either side of the neck. Respiratory: Breathing comfortably  Skin: No facial/neck lesions or rash noted.  Procedures  Assessment: Throat symptoms questionable etiology.  Normal oropharyngeal and hypopharyngeal examination with no evidence of infection or neoplasm.  Plan: Discussed with him that if he has any recurrent symptoms would initially gargle with Listerine or scope mouth rinse. Also briefly discussed with him that this could be related to GE reflux disease and could try Prilosec once a day for 2 to 3 weeks before dinner as silent reflux generally occurs at  night. If he has any persistent symptoms he will follow-up here for recheck as needed.  Radene Journey, MD

## 2021-01-20 DIAGNOSIS — L819 Disorder of pigmentation, unspecified: Secondary | ICD-10-CM | POA: Diagnosis not present

## 2021-01-20 DIAGNOSIS — L578 Other skin changes due to chronic exposure to nonionizing radiation: Secondary | ICD-10-CM | POA: Diagnosis not present

## 2021-01-20 DIAGNOSIS — L821 Other seborrheic keratosis: Secondary | ICD-10-CM | POA: Diagnosis not present

## 2021-01-20 DIAGNOSIS — L814 Other melanin hyperpigmentation: Secondary | ICD-10-CM | POA: Diagnosis not present

## 2021-01-20 DIAGNOSIS — D1801 Hemangioma of skin and subcutaneous tissue: Secondary | ICD-10-CM | POA: Diagnosis not present

## 2021-01-20 DIAGNOSIS — L905 Scar conditions and fibrosis of skin: Secondary | ICD-10-CM | POA: Diagnosis not present

## 2021-01-20 DIAGNOSIS — Z85828 Personal history of other malignant neoplasm of skin: Secondary | ICD-10-CM | POA: Diagnosis not present

## 2021-04-09 DIAGNOSIS — M1711 Unilateral primary osteoarthritis, right knee: Secondary | ICD-10-CM | POA: Diagnosis not present

## 2021-04-09 DIAGNOSIS — M7051 Other bursitis of knee, right knee: Secondary | ICD-10-CM | POA: Diagnosis not present

## 2021-04-11 LAB — IFOBT (OCCULT BLOOD)
IFOBT: NEGATIVE
IFOBT: NEGATIVE

## 2021-04-11 LAB — FECAL OCCULT BLOOD, GUAIAC: Fecal Occult Blood: NEGATIVE

## 2021-05-02 ENCOUNTER — Encounter: Payer: Self-pay | Admitting: Family Medicine

## 2021-05-06 DIAGNOSIS — H5203 Hypermetropia, bilateral: Secondary | ICD-10-CM | POA: Diagnosis not present

## 2021-05-06 DIAGNOSIS — M25561 Pain in right knee: Secondary | ICD-10-CM | POA: Diagnosis not present

## 2021-05-06 DIAGNOSIS — H1789 Other corneal scars and opacities: Secondary | ICD-10-CM | POA: Diagnosis not present

## 2021-07-28 ENCOUNTER — Ambulatory Visit: Payer: Medicare HMO | Admitting: Family Medicine

## 2021-08-04 DIAGNOSIS — L814 Other melanin hyperpigmentation: Secondary | ICD-10-CM | POA: Diagnosis not present

## 2021-08-04 DIAGNOSIS — L821 Other seborrheic keratosis: Secondary | ICD-10-CM | POA: Diagnosis not present

## 2021-08-04 DIAGNOSIS — L570 Actinic keratosis: Secondary | ICD-10-CM | POA: Diagnosis not present

## 2021-08-04 DIAGNOSIS — D1801 Hemangioma of skin and subcutaneous tissue: Secondary | ICD-10-CM | POA: Diagnosis not present

## 2021-08-04 DIAGNOSIS — L538 Other specified erythematous conditions: Secondary | ICD-10-CM | POA: Diagnosis not present

## 2021-08-04 DIAGNOSIS — Z789 Other specified health status: Secondary | ICD-10-CM | POA: Diagnosis not present

## 2021-08-04 DIAGNOSIS — L820 Inflamed seborrheic keratosis: Secondary | ICD-10-CM | POA: Diagnosis not present

## 2021-09-24 ENCOUNTER — Other Ambulatory Visit: Payer: Medicare HMO

## 2021-09-24 ENCOUNTER — Other Ambulatory Visit: Payer: Self-pay

## 2021-09-24 DIAGNOSIS — Z0000 Encounter for general adult medical examination without abnormal findings: Secondary | ICD-10-CM | POA: Diagnosis not present

## 2021-09-24 DIAGNOSIS — Z13220 Encounter for screening for lipoid disorders: Secondary | ICD-10-CM | POA: Diagnosis not present

## 2021-09-24 DIAGNOSIS — Z136 Encounter for screening for cardiovascular disorders: Secondary | ICD-10-CM | POA: Diagnosis not present

## 2021-09-24 LAB — LIPID PANEL
Chol/HDL Ratio: 2.8 ratio (ref 0.0–5.0)
Cholesterol, Total: 154 mg/dL (ref 100–199)
HDL: 56 mg/dL (ref >39)
LDL Chol Calc (NIH): 77 mg/dL (ref 0–99)
Triglycerides: 120 mg/dL (ref 0–149)
VLDL Cholesterol Cal: 21 mg/dL (ref 5–40)

## 2021-09-24 LAB — COMPREHENSIVE METABOLIC PANEL
ALT: 26 IU/L (ref 0–44)
AST: 26 IU/L (ref 0–40)
Albumin/Globulin Ratio: 2.5 — ABNORMAL HIGH (ref 1.2–2.2)
Albumin: 4.8 g/dL (ref 3.8–4.8)
Alkaline Phosphatase: 55 IU/L (ref 44–121)
BUN/Creatinine Ratio: 15 (ref 10–24)
BUN: 15 mg/dL (ref 8–27)
Bilirubin Total: 0.6 mg/dL (ref 0.0–1.2)
CO2: 26 mmol/L (ref 20–29)
Calcium: 9.5 mg/dL (ref 8.6–10.2)
Chloride: 103 mmol/L (ref 96–106)
Creatinine, Ser: 0.98 mg/dL (ref 0.76–1.27)
Globulin, Total: 1.9 g/dL (ref 1.5–4.5)
Glucose: 94 mg/dL (ref 70–99)
Potassium: 4.7 mmol/L (ref 3.5–5.2)
Sodium: 140 mmol/L (ref 134–144)
Total Protein: 6.7 g/dL (ref 6.0–8.5)
eGFR: 84 mL/min/{1.73_m2} (ref >59)

## 2021-09-24 LAB — CBC WITH DIFFERENTIAL/PLATELET
Basophils Absolute: 0.1 10*3/uL (ref 0.0–0.2)
Basos: 1 %
EOS (ABSOLUTE): 0.1 10*3/uL (ref 0.0–0.4)
Eos: 2 %
Hematocrit: 46.8 % (ref 37.5–51.0)
Hemoglobin: 16.8 g/dL (ref 13.0–17.7)
Immature Grans (Abs): 0 10*3/uL (ref 0.0–0.1)
Immature Granulocytes: 0 %
Lymphocytes Absolute: 1.9 10*3/uL (ref 0.7–3.1)
Lymphs: 31 %
MCH: 31.1 pg (ref 26.6–33.0)
MCHC: 35.9 g/dL — ABNORMAL HIGH (ref 31.5–35.7)
MCV: 87 fL (ref 79–97)
Monocytes Absolute: 0.5 10*3/uL (ref 0.1–0.9)
Monocytes: 8 %
Neutrophils Absolute: 3.5 10*3/uL (ref 1.4–7.0)
Neutrophils: 58 %
Platelets: 162 10*3/uL (ref 150–450)
RBC: 5.4 x10E6/uL (ref 4.14–5.80)
RDW: 12.9 % (ref 11.6–15.4)
WBC: 6.1 10*3/uL (ref 3.4–10.8)

## 2021-09-26 ENCOUNTER — Encounter: Payer: Self-pay | Admitting: Family Medicine

## 2021-09-26 ENCOUNTER — Other Ambulatory Visit: Payer: Self-pay

## 2021-09-26 ENCOUNTER — Ambulatory Visit (INDEPENDENT_AMBULATORY_CARE_PROVIDER_SITE_OTHER): Payer: Medicare HMO | Admitting: Family Medicine

## 2021-09-26 VITALS — BP 122/74 | HR 70 | Temp 98.1°F | Ht 72.5 in | Wt 187.4 lb

## 2021-09-26 DIAGNOSIS — N401 Enlarged prostate with lower urinary tract symptoms: Secondary | ICD-10-CM | POA: Diagnosis not present

## 2021-09-26 DIAGNOSIS — R519 Headache, unspecified: Secondary | ICD-10-CM | POA: Diagnosis not present

## 2021-09-26 DIAGNOSIS — Z1211 Encounter for screening for malignant neoplasm of colon: Secondary | ICD-10-CM | POA: Diagnosis not present

## 2021-09-26 DIAGNOSIS — Z13220 Encounter for screening for lipoid disorders: Secondary | ICD-10-CM | POA: Diagnosis not present

## 2021-09-26 DIAGNOSIS — N529 Male erectile dysfunction, unspecified: Secondary | ICD-10-CM

## 2021-09-26 DIAGNOSIS — Z638 Other specified problems related to primary support group: Secondary | ICD-10-CM

## 2021-09-26 DIAGNOSIS — R69 Illness, unspecified: Secondary | ICD-10-CM | POA: Diagnosis not present

## 2021-09-26 DIAGNOSIS — Z0000 Encounter for general adult medical examination without abnormal findings: Secondary | ICD-10-CM

## 2021-09-26 DIAGNOSIS — R351 Nocturia: Secondary | ICD-10-CM | POA: Diagnosis not present

## 2021-09-26 MED ORDER — IBUPROFEN 600 MG PO TABS: 600.0000 mg | ORAL_TABLET | Freq: Four times a day (QID) | ORAL | 1 refills | Status: AC | PRN

## 2021-09-26 MED ORDER — NITROGLYCERIN 0.4 MG SL SUBL: 0.4000 mg | SUBLINGUAL_TABLET | SUBLINGUAL | 3 refills | Status: AC | PRN

## 2021-09-26 NOTE — Progress Notes (Unsigned)
Chad Harris is a 71 y.o. male who presents for annual wellness visit and follow-up on chronic medical conditions.  He has the following concerns:   Immunizations and Health Maintenance Immunization History  Administered Date(s) Administered   Fluad Quad(high Dose 65+) 07/10/2019   Influenza, High Dose Seasonal PF 07/05/2018   Influenza-Unspecified 06/17/2020, 07/09/2021   PFIZER Comirnaty(Gray Top)Covid-19 Tri-Sucrose Vaccine 07/09/2021   PFIZER(Purple Top)SARS-COV-2 Vaccination 10/26/2019, 11/20/2019   Pneumococcal Conjugate-13 07/05/2018   Pneumococcal Polysaccharide-23 07/10/2019   Tdap 06/26/2016   Zoster Recombinat (Shingrix) 05/02/2020, 07/04/2020   Zoster, Live 11/11/2012   Health Maintenance Due  Topic Date Due   Fecal DNA (Cologuard)  07/18/2021    Last colonoscopy: Last PSA: Dentist: Ophtho: Exercise:  Other doctors caring for patient include:  Advanced Directives: Does Patient Have a Medical Advance Directive?: No, Yes Type of Advance Directive: Healthcare Power of Attorney, Living will, Out of facility DNR (pink MOST or yellow form) Does patient want to make changes to medical advance directive?: No - Patient declined Copy of [place] in Chart?: Yes - validated most recent copy scanned in chart (See row information) Would patient like information on creating a medical advance directive?: No - Patient declined Pre-existing out of facility DNR order (yellow form or pink MOST form): Pink MOST form placed in chart (order not valid for inpatient use)  Depression screen:  See questionnaire below.     Depression screen [facility] 2/9 09/26/2021 07/15/2020 07/10/2019 07/05/2018 06/19/2016  Decreased Interest 0 0 0 0 0  Down, Depressed, Hopeless 1 0 0 0 0  PHQ - 2 Score 1 0 0 0 0    Fall Screen: See Questionaire below.   Fall Risk  09/26/2021 07/15/2020 07/10/2019 07/05/2018 06/19/2016  Falls in the past year? 0 0 0 No No  Number falls in past  yr: 0 - - - -  Injury with Fall? 0 - - - -  Risk for fall due to : No Fall Risks No Fall Risks - - -  Follow up Falls evaluation completed - - - -    ADL screen:  See questionnaire below.  Functional Status Survey: Is the patient deaf or have difficulty hearing?: Yes (right ear) Does the patient have difficulty seeing, even when wearing glasses/contacts?: No Does the patient have difficulty concentrating, remembering, or making decisions?: No Does the patient have difficulty walking or climbing stairs?: No Does the patient have difficulty dressing or bathing?: No Does the patient have difficulty doing errands alone such as visiting a doctor's office or shopping?: No   Review of Systems  Constitutional: -, -unexpected weight change, -anorexia, -fatigue Allergy: -sneezing, -itching, -congestion Dermatology: denies changing moles, rash, lumps ENT: -runny nose, -ear pain, -sore throat,  Cardiology:  -chest pain, -palpitations, -orthopnea, Respiratory: -cough, -shortness of breath, -dyspnea on exertion, -wheezing,  Gastroenterology: -abdominal pain, -nausea, -vomiting, -diarrhea, -constipation, -dysphagia Hematology: -bleeding or bruising problems Musculoskeletal: -arthralgias, -myalgias, -joint swelling, -back pain, - Ophthalmology: -vision changes,  Urology: -dysuria, -difficulty urinating,  -urinary frequency, -urgency, incontinence Neurology: -, -numbness, , -memory loss, -falls, -dizziness    PHYSICAL EXAM:  There were no vitals taken for this visit.  General Appearance: Alert, cooperative, no distress, appears stated age Head: Normocephalic, without obvious abnormality, atraumatic Eyes: PERRL, conjunctiva/corneas clear, EOM's intact, fundi benign Ears: Normal TM's and external ear canals Nose: Nares normal, mucosa normal, no drainage or sinus   tenderness Throat: Lips, mucosa, and tongue normal; teeth and gums normal Neck: Supple, no  lymphadenopathy, thyroid:no  enlargement/tenderness/nodules; no carotid bruit or JVD Lungs: Clear to auscultation bilaterally without wheezes, rales or ronchi; respirations unlabored Heart: Regular rate and rhythm, S1 and S2 normal, no murmur, rub or gallop Abdomen: Soft, non-tender, nondistended, normoactive bowel sounds, no masses, no hepatosplenomegaly Extremities: No clubbing, cyanosis or edema Pulses: 2+ and symmetric all extremities Skin: Skin color, texture, turgor normal, no rashes or lesions Lymph nodes: Cervical, supraclavicular, and axillary nodes normal Neurologic: CNII-XII intact, normal strength, sensation and gait; reflexes 2+ and symmetric throughout   Psych: Normal mood, affect, hygiene and grooming  ASSESSMENT/PLAN:    Discussed PSA screening (risks/benefits), recommended at least 30 minutes of aerobic activity at least 5 days/week; proper sunscreen use reviewed; healthy diet and alcohol recommendations (less than or equal to 2 drinks/day) reviewed; regular seatbelt use; changing batteries in smoke detectors. Immunization recommendations discussed.  Colonoscopy recommendations reviewed.   Medicare Attestation I have personally reviewed: The patient's medical and social history Their use of alcohol, tobacco or illicit drugs Their current medications and supplements The patient's functional ability including ADLs,fall risks, home safety risks, cognitive, and hearing and visual impairment Diet and physical activities Evidence for depression or mood disorders  The patient's weight, height, and BMI have been recorded in the chart.  I have made referrals, counseling, and provided education to the patient based on review of the above and I have provided the patient with a written personalized care plan for preventive services.     Elyse Jarvis, RMA   09/26/2021

## 2021-09-26 NOTE — Progress Notes (Signed)
Chad Harris is a 71 y.o. male who presents for annual wellness visit and follow-up on chronic medical conditions.  He and his wife are dealing with some family issues revolving around one of his daughters and apparently have to do with things that occurred earlier in her life that she is now trying to work through.  This is caused an estrangement in the family and they are involved in counseling for this.  He recently had a URI and the cough is slowly going away.  He also complains of a several month history of a dull headache that is intermittent in nature.  No sneezing, itchy watery eyes, numbness, tingling, weakness, sore throat, earache.  It will go away within a relatively short period of time.  He is occasionally taken Tylenol or ibuprofen for that.  He has a history of BPH but has not mentioned any difficulties with that recently.  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  He would also like a refill on ibuprofen for general pain medicines.  He does occasionally use sildenafil.   Immunizations and Health Maintenance Immunization History  Administered Date(s) Administered   Fluad Quad(high Dose 65+) 07/10/2019   Influenza, High Dose Seasonal PF 07/05/2018   Influenza-Unspecified 06/17/2020, 07/09/2021   PFIZER Comirnaty(Gray Top)Covid-19 Tri-Sucrose Vaccine 07/09/2021   PFIZER(Purple Top)SARS-COV-2 Vaccination 10/26/2019, 11/20/2019   Pneumococcal Conjugate-13 07/05/2018   Pneumococcal Polysaccharide-23 07/10/2019   Tdap 06/26/2016   Zoster Recombinat (Shingrix) 05/02/2020, 07/04/2020   Zoster, Live 11/11/2012   Health Maintenance Due  Topic Date Due   Fecal DNA (Cologuard)  07/18/2021    Last colonoscopy: 2010 cologuard 07/18/18 negative, FIT 04/13/21 negative Last PSA: 07/10/19 (2.6) Dentist: Q six months   Ophtho: Q year Exercise: five days a week for an hour    Other doctors caring for patient include: Dr. Delman Cheadle [facility]            Dr. Lucia Gaskins ENT            Dr. Sherilyn Banker Audio  Advanced Directives: Does Patient Have a Medical Advance Directive?: Yes Type of Advance Directive: Living will, Healthcare Power of Attorney Does patient want to make changes to medical advance directive?: No - Patient declined Copy of [place] in Chart?: Yes - validated most recent copy scanned in chart (See row information)  Depression screen:  See questionnaire below.     Depression screen [facility] 2/9 09/26/2021 07/15/2020 07/10/2019 07/05/2018 06/19/2016  Decreased Interest 0 0 0 0 0  Down, Depressed, Hopeless 1 0 0 0 0  PHQ - 2 Score 1 0 0 0 0    Fall Screen: See Questionaire below.   Fall Risk  09/26/2021 07/15/2020 07/10/2019 07/05/2018 06/19/2016  Falls in the past year? 0 0 0 No No  Number falls in past yr: 0 - - - -  Injury with Fall? 0 - - - -  Risk for fall due to : No Fall Risks No Fall Risks - - -  Follow up Falls evaluation completed - - - -    ADL screen:  See questionnaire below.  Functional Status Survey: Is the patient deaf or have difficulty hearing?: Yes (right ear) Does the patient have difficulty seeing, even when wearing glasses/contacts?: No Does the patient have difficulty concentrating, remembering, or making decisions?: No Does the patient have difficulty walking or climbing stairs?: No Does the  patient have difficulty dressing or bathing?: No Does the patient have difficulty doing errands alone such as visiting a doctor's office or shopping?: No   Review of Systems  Constitutional: -, -unexpected weight change, -anorexia, -fatigue Allergy: -sneezing, -itching, -congestion Dermatology: denies changing moles, rash, lumps ENT: -runny nose, -ear pain, -sore throat,  Cardiology:  -chest pain, -palpitations, -orthopnea, Respiratory: -cough, -shortness of breath, -dyspnea on exertion, -wheezing,  Gastroenterology: -abdominal  pain, -nausea, -vomiting, -diarrhea, -constipation, -dysphagia Hematology: -bleeding or bruising problems Musculoskeletal: -arthralgias, -myalgias, -joint swelling, -back pain, - Ophthalmology: -vision changes,  Urology: -dysuria, -difficulty urinating,  -urinary frequency, -urgency, incontinence Neurology: -, -numbness, , -memory loss, -falls, -dizziness    PHYSICAL EXAM:  General Appearance: Alert, cooperative, no distress, appears stated age Head: Normocephalic, without obvious abnormality, atraumatic Eyes: PERRL, conjunctiva/corneas clear, EOM's intact, fundi benign Ears: Normal TM's and external ear canals Nose: Nares normal, mucosa normal, no drainage or sinus   tenderness Throat: Lips, mucosa, and tongue normal; teeth and gums normal Neck: Supple, no lymphadenopathy, thyroid:no enlargement/tenderness/nodules; no carotid bruit or JVD Lungs: Clear to auscultation bilaterally without wheezes, rales or ronchi; respirations unlabored Heart: Regular rate and rhythm, S1 and S2 normal, no murmur, rub or gallop Abdomen: Soft, non-tender, nondistended, normoactive bowel sounds, no masses, no hepatosplenomegaly Extremities: No clubbing, cyanosis or edema Pulses: 2+ and symmetric all extremities Skin: Skin color, texture, turgor normal, no rashes or lesions Lymph nodes: Cervical, supraclavicular, and axillary nodes normal Neurologic: CNII-XII intact, normal strength, sensation and gait; reflexes 2+ and symmetric throughout   Psych: Normal mood, affect, hygiene and grooming  ASSESSMENT/PLAN: Routine general medical examination at a health care facility  Screening for colon cancer - Plan: Cologuard  Erectile dysfunction, unspecified erectile dysfunction type  Screening for lipid disorders  BPH associated with nocturia  Stress due to family tension  Nonintractable headache, unspecified chronicity pattern, unspecified headache type Discussed the headache with him and since its not  really a pattern and not associated with anything definitive, recommend watchful waiting and the use of Tylenol.  If this continues might need to refer to neurology for more definitive evaluation.  He was comfortable with that. Discussed with family tension that he is under and gave some thoughts on the proper verbiage specifically avoiding saying I am sorry but acknowledging where someone was coming from without excepting responsibility for that. He will call when he needs a refill on his ED med. Discussed PSA screening (risks/benefits), recommended at least 30 minutes of aerobic activity at least 5 days/week; ; healthy diet and alcohol recommendations (less than or equal to 2 drinks/day) reviewed; Mar[place] Kitchen Immunization recommendations discussed.  Colonoscopy recommendations reviewed.   Medicare Attestation I have personally reviewed: The patient's medical and social history Their use of alcohol, tobacco or illicit drugs Their current medications and supplements The patient's functional ability including ADLs,fall risks, home safety risks, cognitive, and hearing and visual impairment Diet and physical activities Evidence for depression or mood disorders  The patient's weight, height, and BMI have been recorded in the chart.  I have made referrals, counseling, and provided education to the patient based on review of the above and I have provided the patient with a written personalized care plan for preventive services.     Jill Alexanders, MD   09/26/2021

## 2021-09-30 DIAGNOSIS — Z1211 Encounter for screening for malignant neoplasm of colon: Secondary | ICD-10-CM | POA: Diagnosis not present

## 2021-10-06 LAB — COLOGUARD: COLOGUARD: NEGATIVE

## 2021-10-08 ENCOUNTER — Encounter: Payer: Self-pay | Admitting: Family Medicine

## 2021-10-08 ENCOUNTER — Telehealth (INDEPENDENT_AMBULATORY_CARE_PROVIDER_SITE_OTHER): Payer: Medicare HMO | Admitting: Family Medicine

## 2021-10-08 VITALS — Temp 99.0°F | Ht 72.5 in | Wt 184.0 lb

## 2021-10-08 DIAGNOSIS — U071 COVID-19: Secondary | ICD-10-CM

## 2021-10-08 MED ORDER — MOLNUPIRAVIR EUA 200MG CAPSULE: 4.0000 | ORAL_CAPSULE | Freq: Two times a day (BID) | ORAL | 0 refills | Status: AC

## 2021-10-08 NOTE — Progress Notes (Signed)
Start time: 12:18 End time: 12:40  Virtual Visit via Video Note  I connected with Chad Harris on 10/08/21 by a video enabled telemedicine application and verified that I am speaking with the correct person using two identifiers.  Location: Patient: in [place] Provider: office   I discussed the limitations of evaluation and management by telemedicine and the availability of in person appointments. The patient expressed understanding and agreed to proceed.  History of Present Illness:  Chief Complaint  Patient presents with   Covid Positive    VIRTUAL positive yesterday morning. Cough, mild HA, myalgias and congestion. No fever.    Monday night he start coughing, it was waking him up from sleep. Yesterday morning his COVID test was + Current symptoms include:  Mild HA, myalgia, rhinorrhea (clear). Coughs sporadically. Kept him up the last 2 nights.  OTC meds taken: Motrin, robitussin DM  He is in [place], [place].  He attended a conference there this past weekend, and visiting his daughter who  moved there.   He also has the 2nd part of the conference, starting a week from today, asking if he will be able to attend.  PMH, PSH, SH reviewed  Outpatient Encounter Medications as of 10/08/2021  Medication Sig Note   acetaminophen (TYLENOL) 500 MG tablet Take 1,000 mg by mouth every 6 (six) hours as needed. 10/08/2021: Last dose yesterday 4pm   guaiFENesin-dextromethorphan (ROBITUSSIN DM) 100-10 MG/5ML syrup Take 20 mLs by mouth every 4 (four) hours as needed for cough. 10/08/2021: Last dose 9am   ibuprofen (ADVIL) 600 MG tablet Take 1 tablet (600 mg total) by mouth every 6 (six) hours as needed. 10/08/2021: Last dose last night 10pm   molnupiravir EUA (LAGEVRIO) 200 mg CAPS capsule Take 4 capsules (800 mg total) by mouth 2 (two) times daily for 5 days.    calcium carbonate 200 MG capsule Take 250 mg by mouth 2 (two) times daily with a meal. (Patient not taking: Reported on 09/26/2021)     doxycycline (VIBRA-TABS) 100 MG tablet Take 1 tablet (100 mg total) by mouth 2 (two) times daily. (Patient not taking: Reported on 09/26/2021)    Multiple Vitamins-Minerals (MULTIVITAMIN WITH MINERALS) tablet Take 1 tablet by mouth daily. (Patient not taking: Reported on 10/08/2021)    nitroGLYCERIN (NITROSTAT) 0.4 MG SL tablet Place 1 tablet (0.4 mg total) under the tongue every 5 (five) minutes as needed for chest pain. (Patient not taking: Reported on 10/08/2021)    No facility-administered encounter medications on file as of 10/08/2021.   No Known Allergies  ROS: no fever, n/v/d/rash. +URI symptoms per HPI.  No shortness of breath, chest pain.   Observations/Objective:  Temp 99 F (37.2 C) (Oral)    Ht 6' 0.5" (1.842 m)    Wt 184 lb (83.5 kg)    BMI 24.61 kg/m   Well-appearing, pleasant male, in no distress. No throat-clearing, sniffling or coughing during visit. He is speaking comfortably. Exam is limited due to virtual nature of the visit.   Assessment and Plan:  COVID-19 virus infection - Plan: molnupiravir EUA (LAGEVRIO) 200 mg CAPS capsule  Discussed tessalon/hydrocodone. He will contact us if needed.  Cont guaif/DM.  Add decongestants prn   Days 1-5  Tues-Sat 6-10  Sun-Thurs Tests at conference, prior to starting.  Discussed CDC recommendations. Discussed paxlovid vs molnupiravir in detail.     Follow Up Instructions:    I discussed the assessment and treatment plan with the patient. The patient was provided an  opportunity to ask questions and all were answered. The patient agreed with the plan and demonstrated an understanding of the instructions.   The patient was advised to call back or seek an in-person evaluation if the symptoms worsen or if the condition fails to improve as anticipated.  I spent 24 minutes dedicated to the care of this patient, including pre-visit review of records, face to face time, post-visit ordering of testing and  documentation.    Vikki Ports, MD

## 2021-10-08 NOTE — Patient Instructions (Signed)
Stay well hydrated. Continue Robitussin DM or switch to a longer-acting medication such as Mucinex DM 12 hour, and take it twice daily. You may use decongestants to help with runny nose and postnasal drainage (phenylephrine or pseudoephedrine). Continue ibuprofen (or Tylenol) as needed for fever or aches.  Take the antiviral medication twice daily for 5 days.  You need to completely isolate for days 1-5 (through Saturday). Days 6-10 (Sunday through Thursday), if afebrile and respiratory symptoms are improving, you may leave isolation, but wear a mask 100% of the time (do not eat around others). Restrictions end on day 11 (next Friday).  If your cough is bothersome, let me know if you prefer benzonatate (the pills that you can take three times/day, won't make you sleepy) or if the cough is mainly at night, if you want the shorter versus longer-acting hydrocodone syrup (hycodan vs tussionex).    It was a pleasure meeting you. I hope you feel better soon and enjoy the rest of your time in [place]!

## 2021-10-21 ENCOUNTER — Telehealth (INDEPENDENT_AMBULATORY_CARE_PROVIDER_SITE_OTHER): Payer: Medicare HMO | Admitting: Family Medicine

## 2021-10-21 ENCOUNTER — Encounter: Payer: Self-pay | Admitting: Family Medicine

## 2021-10-21 ENCOUNTER — Other Ambulatory Visit: Payer: Self-pay

## 2021-10-21 VITALS — Temp 97.6°F | Wt 184.0 lb

## 2021-10-21 DIAGNOSIS — U071 COVID-19: Secondary | ICD-10-CM | POA: Diagnosis not present

## 2021-10-21 NOTE — Progress Notes (Signed)
° °  Subjective:    Patient ID: Chad Harris, male    DOB: 06/24/1951, 71 y.o.   MRN: [id number]  HPI Documentation for virtual audio and video telecommunications through [place] encounter: The patient was located at home. 2 patient identifiers used.  The provider was located in the office. The patient did consent to this visit and is aware of possible charges through their insurance for this visit. The other persons participating in this telemedicine service were none. Time spent on call was 5 minutes and in review of previous records >4minutes total for counseling and coordination of care. This virtual service is not related to other E/M service within previous 7 days.  He tested positive for COVID on January 18 and was placed on lopinavir.  He states that he responded quite quickly to the medication feeling much better but still having difficulty with a slight hoarse voice and fatigue.  No fever, chills, cough or congestion.  He also continue to test and this test remained positive.  He has concerns on how to handle this.  Review of Systems     Objective:   Physical Exam Alert and in no distress otherwise not examined       Assessment & Plan:  COVID-19 virus infection I explained that the test can remain positive for a long period of time and since he is essentially recovered or in the process of recovering no further testing is needed.  Did recommend that he wear a mask especially if he spends time around his mother-in-law who is 57.  Otherwise after the first 5 days he was sequestered in for the next 5 days would wear a mask.  He was comfortable with that.  Did recommend that he not do any further testing

## 2022-02-03 DIAGNOSIS — L821 Other seborrheic keratosis: Secondary | ICD-10-CM | POA: Diagnosis not present

## 2022-02-03 DIAGNOSIS — Z85828 Personal history of other malignant neoplasm of skin: Secondary | ICD-10-CM | POA: Diagnosis not present

## 2022-02-03 DIAGNOSIS — Z08 Encounter for follow-up examination after completed treatment for malignant neoplasm: Secondary | ICD-10-CM | POA: Diagnosis not present

## 2022-02-03 DIAGNOSIS — L814 Other melanin hyperpigmentation: Secondary | ICD-10-CM | POA: Diagnosis not present

## 2022-02-03 DIAGNOSIS — D1801 Hemangioma of skin and subcutaneous tissue: Secondary | ICD-10-CM | POA: Diagnosis not present

## 2022-03-12 ENCOUNTER — Encounter: Payer: Self-pay | Admitting: Medical

## 2022-03-12 ENCOUNTER — Telehealth (INDEPENDENT_AMBULATORY_CARE_PROVIDER_SITE_OTHER): Payer: Medicare HMO | Admitting: Medical

## 2022-03-12 VITALS — BP 122/70 | Wt 184.0 lb

## 2022-03-12 DIAGNOSIS — R058 Other specified cough: Secondary | ICD-10-CM | POA: Diagnosis not present

## 2022-03-12 DIAGNOSIS — J988 Other specified respiratory disorders: Secondary | ICD-10-CM | POA: Diagnosis not present

## 2022-03-12 MED ORDER — EMERGEN-C IMMUNE PLUS PO PACK: 1.0000 | PACK | Freq: Two times a day (BID) | ORAL | 0 refills | Status: DC

## 2022-03-12 MED ORDER — AZITHROMYCIN 250 MG PO TABS: ORAL_TABLET | ORAL | 0 refills | Status: DC

## 2022-05-27 ENCOUNTER — Encounter: Payer: Self-pay | Admitting: Internal Medicine

## 2022-06-30 ENCOUNTER — Encounter: Payer: Self-pay | Admitting: Internal Medicine

## 2022-07-13 ENCOUNTER — Encounter: Payer: Self-pay | Admitting: Internal Medicine

## 2022-08-11 DIAGNOSIS — H52203 Unspecified astigmatism, bilateral: Secondary | ICD-10-CM | POA: Diagnosis not present

## 2022-08-11 DIAGNOSIS — H04123 Dry eye syndrome of bilateral lacrimal glands: Secondary | ICD-10-CM | POA: Diagnosis not present

## 2022-08-12 ENCOUNTER — Telehealth: Payer: Self-pay | Admitting: Family Medicine

## 2022-08-12 NOTE — Telephone Encounter (Signed)
Chad Harris asks if you can please give him a call, says it is personal but not urgent.

## 2022-08-19 DIAGNOSIS — D1801 Hemangioma of skin and subcutaneous tissue: Secondary | ICD-10-CM | POA: Diagnosis not present

## 2022-08-19 DIAGNOSIS — Z85828 Personal history of other malignant neoplasm of skin: Secondary | ICD-10-CM | POA: Diagnosis not present

## 2022-08-19 DIAGNOSIS — Z08 Encounter for follow-up examination after completed treatment for malignant neoplasm: Secondary | ICD-10-CM | POA: Diagnosis not present

## 2022-08-19 DIAGNOSIS — L814 Other melanin hyperpigmentation: Secondary | ICD-10-CM | POA: Diagnosis not present

## 2022-08-19 DIAGNOSIS — L821 Other seborrheic keratosis: Secondary | ICD-10-CM | POA: Diagnosis not present

## 2022-08-19 DIAGNOSIS — L820 Inflamed seborrheic keratosis: Secondary | ICD-10-CM | POA: Diagnosis not present

## 2022-08-19 DIAGNOSIS — R208 Other disturbances of skin sensation: Secondary | ICD-10-CM | POA: Diagnosis not present

## 2022-08-19 DIAGNOSIS — L538 Other specified erythematous conditions: Secondary | ICD-10-CM | POA: Diagnosis not present

## 2022-08-19 DIAGNOSIS — L298 Other pruritus: Secondary | ICD-10-CM | POA: Diagnosis not present

## 2022-08-19 DIAGNOSIS — Z789 Other specified health status: Secondary | ICD-10-CM | POA: Diagnosis not present

## 2022-08-19 DIAGNOSIS — L309 Dermatitis, unspecified: Secondary | ICD-10-CM | POA: Diagnosis not present

## 2022-09-30 ENCOUNTER — Telehealth: Payer: Self-pay | Admitting: Family Medicine

## 2022-09-30 NOTE — Telephone Encounter (Signed)
Left message for patient to call back and schedule Medicare Annual Wellness Visit (AWV) either virtually or in office. Left  my Herbie Drape number [phone]   Last AWV 09/26/21 please schedule with Nurse Health Adviser   45 min for awv-i  in office appointments 30 min for awv  phone/virtual appointments

## 2022-10-02 ENCOUNTER — Ambulatory Visit: Payer: Medicare HMO | Admitting: Family Medicine

## 2022-10-26 DIAGNOSIS — Z0100 Encounter for examination of eyes and vision without abnormal findings: Secondary | ICD-10-CM | POA: Diagnosis not present

## 2022-10-27 DIAGNOSIS — D234 Other benign neoplasm of skin of scalp and neck: Secondary | ICD-10-CM | POA: Diagnosis not present

## 2022-10-27 DIAGNOSIS — D485 Neoplasm of uncertain behavior of skin: Secondary | ICD-10-CM | POA: Diagnosis not present

## 2022-11-23 NOTE — Progress Notes (Unsigned)
Chad Harris is a 72 y.o. male who presents for annual wellness visit,CPE and follow-up on chronic medical conditions.  He and his wife are dealing a lot with her mother who is showing some decline in her health and he is helping take care of his brother who is now in assisted living.  They are both quite busy with these 2 situations as well as dealing with 2 daughters that still require some parental guidance.  He does complain of a dull like headache as well as a slight cough.  The cough has been there since his COVID.  He did stop drinking to see if this would help with his headaches.  They are very vague and are not debilitating.  He does have a history of BPH and ED bed present these are not causing trouble.  Otherwise he has no particular concerns or complaints.   Immunizations and Health Maintenance Immunization History  Administered Date(s) Administered   Fluad Quad(high Dose 65+) 07/10/2019   Influenza, High Dose Seasonal PF 07/05/2018, 07/13/2022   Influenza-Unspecified 06/17/2020, 07/09/2021   PFIZER Comirnaty(Gray Top)Covid-19 Tri-Sucrose Vaccine 07/09/2021, 07/13/2022   PFIZER(Purple Top)SARS-COV-2 Vaccination 10/26/2019, 11/20/2019   Pneumococcal Conjugate-13 07/05/2018   Pneumococcal Polysaccharide-23 07/10/2019   Respiratory Syncytial Virus Vaccine,Recomb Aduvanted(Arexvy) 09/01/2022   Tdap 06/26/2016   Tetanus 10/21/2007   Zoster Recombinat (Shingrix) 05/02/2020, 07/04/2020   Zoster, Live 11/11/2012   Health Maintenance Due  Topic Date Due   COVID-19 Vaccine (5 - 2023-24 season) 09/07/2022    Last colonoscopy:Dr. Amedeo Plenty greater than 10 years ago Last PSA:07/10/2019 Dentist: Dr. Rubie Maid Ophtho: Dr. Melissa Noon Exercise:5 days a week, 60 minutes per day  Other doctors caring for patient include: Derm: Dr. Anabel Bene   Advanced Directives: yes, scanned into chart    Depression screen:  See questionnaire below.        11/24/2022    1:46 PM 09/26/2021    2:51  PM 07/15/2020    9:00 AM 07/10/2019    8:47 AM 07/05/2018    1:39 PM  Depression screen PHQ 2/9  Decreased Interest 0 0 0 0 0  Down, Depressed, Hopeless 0 1 0 0 0  PHQ - 2 Score 0 1 0 0 0    Fall Screen: See Questionaire below.      11/24/2022    1:46 PM 09/26/2021    2:51 PM 07/15/2020    8:59 AM 07/10/2019    8:47 AM 07/05/2018    1:39 PM  [place] in the past year? 0 0 0 0 No  Number falls in past yr: 0 0     Injury with Fall? 0 0     Risk for fall due to : No Fall Risks No Fall Risks No Fall Risks    Follow up Falls evaluation completed Falls evaluation completed     The ASCVD Risk score (Arnett DK, et al., 2019) failed to calculate for the following reasons:   The valid total cholesterol range is 130 to 320 mg/dL  ADL screen:  See questionnaire below.  Functional Status Survey: Is the patient deaf or have difficulty hearing?: Yes (has hearing aide r ear) Does the patient have difficulty seeing, even when wearing glasses/contacts?: No Does the patient have difficulty concentrating, remembering, or making decisions?: No Does the patient have difficulty walking or climbing stairs?: No Does the patient have difficulty dressing or bathing?: No Does the patient have difficulty doing errands alone such as visiting a doctor's office or  shopping?: No   Review of Systems  Constitutional: -, -unexpected weight change, -anorexia, -fatigue Allergy: -sneezing, -itching, -congestion Dermatology: denies changing moles, rash, lumps ENT: -runny nose, -ear pain, -sore throat,  Cardiology:  -chest pain, -palpitations, -orthopnea, Respiratory: -cough, -shortness of breath, -dyspnea on exertion, -wheezing,  Gastroenterology: -abdominal pain, -nausea, -vomiting, -diarrhea, -constipation, -dysphagia Hematology: -bleeding or bruising problems Musculoskeletal: -arthralgias, -myalgias, -joint swelling, -back pain, - Ophthalmology: -vision changes,  Urology: -dysuria, -difficulty  urinating,  -urinary frequency, -urgency, incontinence Neurology: -, -numbness, , -memory loss, -falls, -dizziness    PHYSICAL EXAM:  General Appearance: Alert, cooperative, no distress, appears stated age Head: Normocephalic, without obvious abnormality, atraumatic Eyes: PERRL, conjunctiva/corneas clear, EOM's intact, fundi benign Ears: Normal TM's and external ear canals Nose: Nares normal, mucosa normal, no drainage or sinus   tenderness Throat: Lips, mucosa, and tongue normal; teeth and gums normal Neck: Supple, no lymphadenopathy, thyroid:no enlargement/tenderness/nodules; no carotid bruit or JVD Lungs: Clear to auscultation bilaterally without wheezes, rales or ronchi; respirations unlabored Heart: Regular rate and rhythm, S1 and S2 normal, no murmur, rub or gallop Abdomen: Soft, non-tender, nondistended, normoactive bowel sounds, no masses, no hepatosplenomegaly Extremities: No clubbing, cyanosis or edema Pulses: 2+ and symmetric all extremities Skin: Skin color, texture, turgor normal, no rashes or lesions Lymph nodes: Cervical, supraclavicular, and axillary nodes normal Neurologic: CNII-XII intact, normal strength, sensation and gait; reflexes 2+ and symmetric throughout   Psych: Normal mood, affect, hygiene and grooming  ASSESSMENT/PLAN: Routine general medical examination at a health care facility - Plan: CBC with Differential/Platelet, Comprehensive metabolic panel, Lipid panel  BPH associated with nocturia  Erectile dysfunction, unspecified erectile dysfunction type  Screening for lipid disorders - Plan: Lipid panel  Stress due to illness of family member  Presbycusis of right ear, 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  Encouraged him to make sure that he takes time out of his life for both himself and the marriage. . Immunization recommendations discussed.   Colonoscopy recommendations reviewed.   Medicare Attestation I have personally reviewed: The patient's medical and social history Their use of alcohol, tobacco or illicit drugs Their current medications and supplements The patient's functional ability including ADLs,fall risks, home safety risks, cognitive, and hearing and visual impairment Diet and physical activities Evidence for depression or mood disorders  The patient's weight, height, and BMI have been recorded in the chart.  I have made referrals, counseling, and provided education to the patient based on review of the above and I have provided the patient with a written personalized care plan for preventive services.     Jill Alexanders, MD   11/24/2022

## 2022-11-24 ENCOUNTER — Ambulatory Visit (INDEPENDENT_AMBULATORY_CARE_PROVIDER_SITE_OTHER): Payer: Medicare HMO | Admitting: Family Medicine

## 2022-11-24 ENCOUNTER — Encounter: Payer: Self-pay | Admitting: Family Medicine

## 2022-11-24 VITALS — BP 120/74 | HR 66 | Ht 73.0 in | Wt 188.4 lb

## 2022-11-24 DIAGNOSIS — N401 Enlarged prostate with lower urinary tract symptoms: Secondary | ICD-10-CM

## 2022-11-24 DIAGNOSIS — Z13220 Encounter for screening for lipoid disorders: Secondary | ICD-10-CM

## 2022-11-24 DIAGNOSIS — Z0000 Encounter for general adult medical examination without abnormal findings: Secondary | ICD-10-CM | POA: Diagnosis not present

## 2022-11-24 DIAGNOSIS — R351 Nocturia: Secondary | ICD-10-CM | POA: Diagnosis not present

## 2022-11-24 DIAGNOSIS — Z6379 Other stressful life events affecting family and household: Secondary | ICD-10-CM

## 2022-11-24 DIAGNOSIS — N529 Male erectile dysfunction, unspecified: Secondary | ICD-10-CM

## 2022-11-24 DIAGNOSIS — H9111 Presbycusis, right ear: Secondary | ICD-10-CM | POA: Insufficient documentation

## 2022-11-24 DIAGNOSIS — R69 Illness, unspecified: Secondary | ICD-10-CM | POA: Diagnosis not present

## 2022-11-24 DIAGNOSIS — Z638 Other specified problems related to primary support group: Secondary | ICD-10-CM

## 2022-11-25 LAB — CBC WITH DIFFERENTIAL/PLATELET
Basophils Absolute: 0.1 10*3/uL (ref 0.0–0.2)
Basos: 1 %
EOS (ABSOLUTE): 0.2 10*3/uL (ref 0.0–0.4)
Eos: 3 %
Hematocrit: 44.3 % (ref 37.5–51.0)
Hemoglobin: 15 g/dL (ref 13.0–17.7)
Immature Grans (Abs): 0 10*3/uL (ref 0.0–0.1)
Immature Granulocytes: 0 %
Lymphocytes Absolute: 1.5 10*3/uL (ref 0.7–3.1)
Lymphs: 27 %
MCH: 30 pg (ref 26.6–33.0)
MCHC: 33.9 g/dL (ref 31.5–35.7)
MCV: 89 fL (ref 79–97)
Monocytes Absolute: 0.5 10*3/uL (ref 0.1–0.9)
Monocytes: 9 %
Neutrophils Absolute: 3.4 10*3/uL (ref 1.4–7.0)
Neutrophils: 60 %
Platelets: 155 10*3/uL (ref 150–450)
RBC: 5 x10E6/uL (ref 4.14–5.80)
RDW: 12.8 % (ref 11.6–15.4)
WBC: 5.5 10*3/uL (ref 3.4–10.8)

## 2022-11-25 LAB — COMPREHENSIVE METABOLIC PANEL
ALT: 20 IU/L (ref 0–44)
AST: 28 IU/L (ref 0–40)
Albumin/Globulin Ratio: 2 (ref 1.2–2.2)
Albumin: 4.2 g/dL (ref 3.9–4.9)
Alkaline Phosphatase: 54 IU/L (ref 44–121)
BUN/Creatinine Ratio: 13 (ref 10–24)
BUN: 14 mg/dL (ref 8–27)
Bilirubin Total: 0.5 mg/dL (ref 0.0–1.2)
CO2: 24 mmol/L (ref 20–29)
Calcium: 9.2 mg/dL (ref 8.6–10.2)
Chloride: 104 mmol/L (ref 96–106)
Creatinine, Ser: 1.08 mg/dL (ref 0.76–1.27)
Globulin, Total: 2.1 g/dL (ref 1.5–4.5)
Glucose: 93 mg/dL (ref 70–99)
Potassium: 4.6 mmol/L (ref 3.5–5.2)
Sodium: 141 mmol/L (ref 134–144)
Total Protein: 6.3 g/dL (ref 6.0–8.5)
eGFR: 74 mL/min/{1.73_m2} (ref >59)

## 2022-11-25 LAB — LIPID PANEL
Chol/HDL Ratio: 2.1 ratio (ref 0.0–5.0)
Cholesterol, Total: 117 mg/dL (ref 100–199)
HDL: 55 mg/dL (ref >39)
LDL Chol Calc (NIH): 47 mg/dL (ref 0–99)
Triglycerides: 70 mg/dL (ref 0–149)
VLDL Cholesterol Cal: 15 mg/dL (ref 5–40)

## 2023-02-19 DIAGNOSIS — H9011 Conductive hearing loss, unilateral, right ear, with unrestricted hearing on the contralateral side: Secondary | ICD-10-CM | POA: Diagnosis not present

## 2023-03-04 DIAGNOSIS — M7541 Impingement syndrome of right shoulder: Secondary | ICD-10-CM | POA: Diagnosis not present

## 2023-03-22 DIAGNOSIS — M7541 Impingement syndrome of right shoulder: Secondary | ICD-10-CM | POA: Diagnosis not present

## 2023-04-05 DIAGNOSIS — M7541 Impingement syndrome of right shoulder: Secondary | ICD-10-CM | POA: Diagnosis not present

## 2023-04-12 DIAGNOSIS — M7541 Impingement syndrome of right shoulder: Secondary | ICD-10-CM | POA: Diagnosis not present

## 2023-04-20 DIAGNOSIS — M7541 Impingement syndrome of right shoulder: Secondary | ICD-10-CM | POA: Diagnosis not present

## 2023-05-17 DIAGNOSIS — M7541 Impingement syndrome of right shoulder: Secondary | ICD-10-CM | POA: Diagnosis not present

## 2023-05-31 DIAGNOSIS — M7541 Impingement syndrome of right shoulder: Secondary | ICD-10-CM | POA: Diagnosis not present

## 2023-06-07 DIAGNOSIS — M7541 Impingement syndrome of right shoulder: Secondary | ICD-10-CM | POA: Diagnosis not present

## 2023-08-07 NOTE — Progress Notes (Unsigned)
No chief complaint on file.      PMH, PSH, SH reviewed   ROS:    PHYSICAL EXAM:  There were no vitals taken for this visit.      ASSESSMENT/PLAN:

## 2023-08-09 ENCOUNTER — Ambulatory Visit (INDEPENDENT_AMBULATORY_CARE_PROVIDER_SITE_OTHER): Payer: Medicare HMO | Admitting: Family Medicine

## 2023-08-09 ENCOUNTER — Encounter: Payer: Self-pay | Admitting: Family Medicine

## 2023-08-09 VITALS — BP 136/80 | HR 68 | Temp 98.1°F | Ht 73.0 in | Wt 194.8 lb

## 2023-08-09 DIAGNOSIS — Z125 Encounter for screening for malignant neoplasm of prostate: Secondary | ICD-10-CM

## 2023-08-09 DIAGNOSIS — N3281 Overactive bladder: Secondary | ICD-10-CM

## 2023-08-09 DIAGNOSIS — N401 Enlarged prostate with lower urinary tract symptoms: Secondary | ICD-10-CM | POA: Diagnosis not present

## 2023-08-09 DIAGNOSIS — R3915 Urgency of urination: Secondary | ICD-10-CM

## 2023-08-09 DIAGNOSIS — R350 Frequency of micturition: Secondary | ICD-10-CM

## 2023-08-09 LAB — POCT URINALYSIS DIP (PROADVANTAGE DEVICE)
Bilirubin, UA: NEGATIVE
Blood, UA: NEGATIVE
Glucose, UA: NEGATIVE mg/dL
Ketones, POC UA: NEGATIVE mg/dL
Leukocytes, UA: NEGATIVE
Nitrite, UA: NEGATIVE
Protein Ur, POC: NEGATIVE mg/dL
Specific Gravity, Urine: 1.005
Urobilinogen, Ur: 0.2
pH, UA: 7

## 2023-08-09 MED ORDER — OXYBUTYNIN CHLORIDE ER 5 MG PO TB24: 5.0000 mg | ORAL_TABLET | Freq: Every day | ORAL | 0 refills | Status: DC

## 2023-08-09 NOTE — Patient Instructions (Signed)
Limit your caffeine. Limit fluids past dinnertime. Continue to stay well hydrated, aiming for pale urine, but don't overdo it.  Try the ditropan XL once daily. You may increase to 2 pills together after 2 weeks if ineffective. If that is working we can change to the 10 mg tablet. If you have side effects or it isn't effective, let us know, and we can try a different medication. Myrbetriq is a different class, that is actually safer to use for folks >65, but usually requires a trial of ditropan (or something like it) before insurance will cover it. If you get no response, then maybe we need to rethink treating for BPH.

## 2023-08-10 LAB — PSA TOTAL (REFLEX TO FREE): Prostate Specific Ag, Serum: 2.2 ng/mL (ref 0.0–4.0)

## 2023-08-16 DIAGNOSIS — L2989 Other pruritus: Secondary | ICD-10-CM | POA: Diagnosis not present

## 2023-08-16 DIAGNOSIS — L814 Other melanin hyperpigmentation: Secondary | ICD-10-CM | POA: Diagnosis not present

## 2023-08-16 DIAGNOSIS — Z85828 Personal history of other malignant neoplasm of skin: Secondary | ICD-10-CM | POA: Diagnosis not present

## 2023-08-16 DIAGNOSIS — H2513 Age-related nuclear cataract, bilateral: Secondary | ICD-10-CM | POA: Diagnosis not present

## 2023-08-16 DIAGNOSIS — H52203 Unspecified astigmatism, bilateral: Secondary | ICD-10-CM | POA: Diagnosis not present

## 2023-08-16 DIAGNOSIS — L820 Inflamed seborrheic keratosis: Secondary | ICD-10-CM | POA: Diagnosis not present

## 2023-08-16 DIAGNOSIS — L821 Other seborrheic keratosis: Secondary | ICD-10-CM | POA: Diagnosis not present

## 2023-08-16 DIAGNOSIS — D2371 Other benign neoplasm of skin of right lower limb, including hip: Secondary | ICD-10-CM | POA: Diagnosis not present

## 2023-08-16 DIAGNOSIS — Z789 Other specified health status: Secondary | ICD-10-CM | POA: Diagnosis not present

## 2023-08-16 DIAGNOSIS — H5203 Hypermetropia, bilateral: Secondary | ICD-10-CM | POA: Diagnosis not present

## 2023-08-16 DIAGNOSIS — L538 Other specified erythematous conditions: Secondary | ICD-10-CM | POA: Diagnosis not present

## 2023-08-16 DIAGNOSIS — Z08 Encounter for follow-up examination after completed treatment for malignant neoplasm: Secondary | ICD-10-CM | POA: Diagnosis not present

## 2023-09-09 ENCOUNTER — Other Ambulatory Visit: Payer: Self-pay | Admitting: Family Medicine

## 2023-09-09 DIAGNOSIS — N3281 Overactive bladder: Secondary | ICD-10-CM

## 2023-09-09 MED ORDER — OXYBUTYNIN CHLORIDE ER 5 MG PO TB24: 5.0000 mg | ORAL_TABLET | Freq: Every day | ORAL | 0 refills | Status: DC

## 2023-09-09 MED ORDER — MUPIROCIN 2 % EX OINT: 1.0000 | TOPICAL_OINTMENT | Freq: Three times a day (TID) | CUTANEOUS | 2 refills | Status: AC

## 2023-09-09 MED ORDER — CEPHALEXIN 500 MG PO CAPS: 500.0000 mg | ORAL_CAPSULE | Freq: Four times a day (QID) | ORAL | 3 refills | Status: DC

## 2023-10-10 ENCOUNTER — Other Ambulatory Visit: Payer: Self-pay | Admitting: Family Medicine

## 2023-10-10 DIAGNOSIS — N3281 Overactive bladder: Secondary | ICD-10-CM

## 2023-11-16 ENCOUNTER — Encounter: Payer: Self-pay | Admitting: Internal Medicine

## 2023-11-25 ENCOUNTER — Ambulatory Visit: Payer: Medicare HMO | Admitting: Family Medicine

## 2023-11-25 ENCOUNTER — Encounter: Payer: Self-pay | Admitting: Family Medicine

## 2023-11-25 VITALS — BP 120/80 | HR 70 | Ht 72.5 in | Wt 193.8 lb

## 2023-11-25 DIAGNOSIS — N5201 Erectile dysfunction due to arterial insufficiency: Secondary | ICD-10-CM

## 2023-11-25 DIAGNOSIS — Z638 Other specified problems related to primary support group: Secondary | ICD-10-CM | POA: Diagnosis not present

## 2023-11-25 DIAGNOSIS — Z0000 Encounter for general adult medical examination without abnormal findings: Secondary | ICD-10-CM

## 2023-11-25 DIAGNOSIS — Z23 Encounter for immunization: Secondary | ICD-10-CM

## 2023-11-25 DIAGNOSIS — H9111 Presbycusis, right ear: Secondary | ICD-10-CM

## 2023-11-25 DIAGNOSIS — N401 Enlarged prostate with lower urinary tract symptoms: Secondary | ICD-10-CM

## 2023-11-25 MED ORDER — AZITHROMYCIN 500 MG PO TABS: 500.0000 mg | ORAL_TABLET | Freq: Every day | ORAL | 0 refills | Status: AC

## 2023-11-25 NOTE — Progress Notes (Addendum)
 Chad Harris is a 73 y.o. male who presents for annual wellness visit and follow-up on chronic medical conditions.  He and his wife are helping for the care of several relatives which is now starting to affect him.  They seem to be handling the situation well.  He is exercising regularly and riding a bicycle.  He is no longer taking [employer].  He does take over-the-counter medications.  He would like a refill on several medicines that he keeps with him when he travels including nitroglycerin for other people, Z-Pak for  use for infections.  And cortisone cream.  He does wear hearing aid on the right.  Does not complain of any erectile dysfunction issues at the present time.  He is retired and enjoying his retirement.  He and his wife along quite nicely.   Immunizations and Health Maintenance Immunization History  Administered Date(s) Administered   Fluad Quad(high Dose 65+) 07/10/2019, 06/07/2023   Influenza, High Dose Seasonal PF 07/05/2018, 07/13/2022   Influenza-Unspecified 06/17/2020, 07/09/2021   PFIZER Comirnaty(Gray Top)Covid-19 Tri-Sucrose Vaccine 07/09/2021, 07/13/2022   PFIZER(Purple Top)SARS-COV-2 Vaccination 10/26/2019, 11/20/2019   PNEUMOCOCCAL CONJUGATE-20 11/25/2023   Pfizer(Comirnaty)Fall Seasonal Vaccine 12 years and older 06/07/2023   Pneumococcal Conjugate-13 07/05/2018   Pneumococcal Polysaccharide-23 07/10/2019   Respiratory Syncytial Virus Vaccine,Recomb Aduvanted(Arexvy) 09/01/2022   Tdap 06/26/2016   Tetanus 10/21/2007   Zoster Recombinant(Shingrix) 05/02/2020, 07/04/2020   Zoster, Live 11/11/2012   There are no preventive care reminders to display for this patient.   Last colonoscopy: has done cologuard last few times, been 10 years last colonoscopy,  Last PSA: Dentist: twice a year [facility] Found, Ophtho: Dr. Emily Filbert Exercise: ride the pelaton 5 times a week,   Other doctors caring for patient include:none  Advanced Directives: yes Does Patient Have  a Medical Advance Directive?: Yes Type of Advance Directive: Healthcare Power of Attorney, Living will Does patient want to make changes to medical advance directive?: Yes (ED - Information included in AVS) Copy of Healthcare Power of Attorney in Chart?: Yes - validated most recent copy scanned in chart (See row information)  Depression screen:  See questionnaire below.        11/25/2023    1:37 PM 11/24/2022    1:46 PM 09/26/2021    2:51 PM 07/15/2020    9:00 AM 07/10/2019    8:47 AM  Depression screen PHQ 2/9  Decreased Interest 0 0 0 0 0  Down, Depressed, Hopeless 0 0 1 0 0  PHQ - 2 Score 0 0 1 0 0    Fall Screen: See Questionaire below.      11/25/2023    1:37 PM 11/24/2022    1:46 PM 09/26/2021    2:51 PM 07/15/2020    8:59 AM 07/10/2019    8:47 AM  Fall Risk   Falls in the past year? 0 0 0 0 0  Number falls in past yr: 0 0 0    Injury with Fall? 0 0 0    Risk for fall due to : No Fall Risks No Fall Risks No Fall Risks No Fall Risks   Follow up Falls evaluation completed Falls evaluation completed Falls evaluation completed      ADL screen:  See questionnaire below.  Functional Status Survey: Is the patient deaf or have difficulty hearing?: Yes Does the patient have difficulty seeing, even when wearing glasses/contacts?: No Does the patient have difficulty concentrating, remembering, or making decisions?: No Does the patient have difficulty walking or  climbing stairs?: No Does the patient have difficulty dressing or bathing?: No Does the patient have difficulty doing errands alone such as visiting a doctor's office or shopping?: No   Review of Systems  Constitutional: -, -unexpected weight change, -anorexia, -fatigue Allergy: -sneezing, -itching, -congestion Dermatology: denies changing moles, rash, lumps ENT: -runny nose, -ear pain, -sore throat,  Cardiology:  -chest pain, -palpitations, -orthopnea, Respiratory: -cough, -shortness of breath, -dyspnea on exertion,  -wheezing,  Gastroenterology: -abdominal pain, -nausea, -vomiting, -diarrhea, -constipation, -dysphagia Hematology: -bleeding or bruising problems Musculoskeletal: -arthralgias, -myalgias, -joint swelling, -back pain, - Ophthalmology: -vision changes,  Urology: -dysuria, -difficulty urinating,  -urinary frequency, -urgency, incontinence Neurology: -, -numbness, , -memory loss, -falls, -dizziness    PHYSICAL EXAM:  BP 120/80   Pulse 70   Ht 6' 0.5" (1.842 m)   Wt 193 lb 12.8 oz (87.9 kg)   BMI 25.92 kg/m   General Appearance: Alert, cooperative, no distress, appears stated age Head: Normocephalic, without obvious abnormality, atraumatic Eyes: PERRL, conjunctiva/corneas clear, EOM's intact,  Ears: Normal TM's and external ear canals Nose: Nares normal, mucosa normal, no drainage or sinus   tenderness Throat: Lips, mucosa, and tongue normal; teeth and gums normal Neck: Supple, no lymphadenopathy, thyroid:no enlargement/tenderness/nodules; no carotid bruit or JVD Lungs: Clear to auscultation bilaterally without wheezes, rales or ronchi; respirations unlabored Heart: Regular rate and rhythm, S1 and S2 normal, no murmur, rub or gallop  Skin: Skin color, texture, turgor normal, no rashes or lesions Lymph nodes: Cervical, supraclavicular nodes normal Neurologic: CNII-XII intact, normal strength, sensation and gait; reflexes 2+ and symmetric throughout   Psych: Normal mood, affect, hygiene and grooming The 10-year ASCVD risk score (Arnett DK, et al., 2019) is: 13.9%   Values used to calculate the score:     Age: 4 years     Sex: Male     Is Non-Hispanic African American: No     Diabetic: No     Tobacco smoker: No     Systolic Blood Pressure: 120 mmHg     Is BP treated: No     HDL Cholesterol: 56 mg/dL     Total Cholesterol: 136 mg/dL  ASSESSMENT/PLAN: Routine physical examination - Plan: CBC with Differential/Platelet, Comprehensive metabolic panel, Lipid panel  Erectile  dysfunction due to arterial insufficiency  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  Colonoscopy recommendations reviewed.  His wife seem to be handling the situation with relatives quite well.  Did discuss the possibility of adding a statin drug to his regimen and we will wait for the results of the blood work.   Medicare Attestation I have personally reviewed: The patient's medical and social history Their use of alcohol, tobacco or illicit drugs Their current medications and supplements The patient's functional ability including ADLs,fall risks, home safety risks, cognitive, and hearing and visual impairment Diet and physical activities Evidence for depression or mood disorders  The patient's weight, height, and BMI have been recorded in the chart.  I have made referrals, counseling, and provided education to the patient based on review of the above and I have provided the patient with a written personalized care plan for preventive services.     Sharlot Gowda, MD   11/25/2023

## 2023-11-26 ENCOUNTER — Encounter: Payer: Self-pay | Admitting: Family Medicine

## 2023-11-26 LAB — CBC WITH DIFFERENTIAL/PLATELET
Basophils Absolute: 0.1 10*3/uL (ref 0.0–0.2)
Basos: 1 %
EOS (ABSOLUTE): 0.2 10*3/uL (ref 0.0–0.4)
Eos: 3 %
Hematocrit: 46 % (ref 37.5–51.0)
Hemoglobin: 16.1 g/dL (ref 13.0–17.7)
Immature Grans (Abs): 0 10*3/uL (ref 0.0–0.1)
Immature Granulocytes: 0 %
Lymphocytes Absolute: 1.6 10*3/uL (ref 0.7–3.1)
Lymphs: 34 %
MCH: 31.3 pg (ref 26.6–33.0)
MCHC: 35 g/dL (ref 31.5–35.7)
MCV: 90 fL (ref 79–97)
Monocytes Absolute: 0.5 10*3/uL (ref 0.1–0.9)
Monocytes: 10 %
Neutrophils Absolute: 2.5 10*3/uL (ref 1.4–7.0)
Neutrophils: 52 %
Platelets: 160 10*3/uL (ref 150–450)
RBC: 5.14 x10E6/uL (ref 4.14–5.80)
RDW: 13.5 % (ref 11.6–15.4)
WBC: 4.8 10*3/uL (ref 3.4–10.8)

## 2023-11-26 LAB — COMPREHENSIVE METABOLIC PANEL
ALT: 20 IU/L (ref 0–44)
AST: 23 IU/L (ref 0–40)
Albumin: 4.2 g/dL (ref 3.8–4.8)
Alkaline Phosphatase: 45 IU/L (ref 44–121)
BUN/Creatinine Ratio: 12 (ref 10–24)
BUN: 14 mg/dL (ref 8–27)
Bilirubin Total: 0.8 mg/dL (ref 0.0–1.2)
CO2: 23 mmol/L (ref 20–29)
Calcium: 9.2 mg/dL (ref 8.6–10.2)
Chloride: 104 mmol/L (ref 96–106)
Creatinine, Ser: 1.18 mg/dL (ref 0.76–1.27)
Globulin, Total: 2 g/dL (ref 1.5–4.5)
Glucose: 89 mg/dL (ref 70–99)
Potassium: 4.8 mmol/L (ref 3.5–5.2)
Sodium: 141 mmol/L (ref 134–144)
Total Protein: 6.2 g/dL (ref 6.0–8.5)
eGFR: 66 mL/min/{1.73_m2} (ref >59)

## 2023-11-26 LAB — LIPID PANEL
Chol/HDL Ratio: 2.4 ratio (ref 0.0–5.0)
Cholesterol, Total: 136 mg/dL (ref 100–199)
HDL: 56 mg/dL (ref >39)
LDL Chol Calc (NIH): 63 mg/dL (ref 0–99)
Triglycerides: 92 mg/dL (ref 0–149)
VLDL Cholesterol Cal: 17 mg/dL (ref 5–40)

## 2024-08-06 ENCOUNTER — Other Ambulatory Visit: Payer: Self-pay | Admitting: Family Medicine

## 2024-08-06 DIAGNOSIS — N3281 Overactive bladder: Secondary | ICD-10-CM

## 2024-08-15 DIAGNOSIS — D1801 Hemangioma of skin and subcutaneous tissue: Secondary | ICD-10-CM | POA: Diagnosis not present

## 2024-08-15 DIAGNOSIS — L821 Other seborrheic keratosis: Secondary | ICD-10-CM | POA: Diagnosis not present

## 2024-08-15 DIAGNOSIS — L814 Other melanin hyperpigmentation: Secondary | ICD-10-CM | POA: Diagnosis not present

## 2024-08-21 DIAGNOSIS — H5203 Hypermetropia, bilateral: Secondary | ICD-10-CM | POA: Diagnosis not present

## 2024-08-21 DIAGNOSIS — H52203 Unspecified astigmatism, bilateral: Secondary | ICD-10-CM | POA: Diagnosis not present

## 2024-08-21 DIAGNOSIS — H04123 Dry eye syndrome of bilateral lacrimal glands: Secondary | ICD-10-CM | POA: Diagnosis not present

## 2024-09-18 ENCOUNTER — Other Ambulatory Visit: Payer: Self-pay | Admitting: Family Medicine

## 2024-09-18 DIAGNOSIS — N3281 Overactive bladder: Secondary | ICD-10-CM

## 2024-11-30 ENCOUNTER — Ambulatory Visit: Payer: Self-pay | Admitting: Family Medicine
